# Patient Record
Sex: Female | Born: 1985
Health system: Southern US, Community
[De-identification: ages and names within clinical notes are randomized; demographics above are authoritative.]

## PROBLEM LIST (undated history)

## (undated) DIAGNOSIS — R51 Headache: Secondary | ICD-10-CM

## (undated) DIAGNOSIS — R519 Headache, unspecified: Secondary | ICD-10-CM

## (undated) DIAGNOSIS — I10 Essential (primary) hypertension: Secondary | ICD-10-CM

## (undated) DIAGNOSIS — I213 ST elevation (STEMI) myocardial infarction of unspecified site: Secondary | ICD-10-CM

## (undated) DIAGNOSIS — J45909 Unspecified asthma, uncomplicated: Secondary | ICD-10-CM

## (undated) DIAGNOSIS — D649 Anemia, unspecified: Secondary | ICD-10-CM

## (undated) DIAGNOSIS — I4901 Ventricular fibrillation: Secondary | ICD-10-CM

## (undated) DIAGNOSIS — I469 Cardiac arrest, cause unspecified: Secondary | ICD-10-CM

---

## 2013-02-05 ENCOUNTER — Emergency Department (INDEPENDENT_AMBULATORY_CARE_PROVIDER_SITE_OTHER): Payer: Self-pay

## 2013-02-05 ENCOUNTER — Encounter (HOSPITAL_COMMUNITY): Payer: Self-pay | Admitting: Emergency Medicine

## 2013-02-05 ENCOUNTER — Emergency Department (INDEPENDENT_AMBULATORY_CARE_PROVIDER_SITE_OTHER)
Admission: EM | Admit: 2013-02-05 | Discharge: 2013-02-05 | Disposition: A | Discharge status: Home / Self Care | Payer: Self-pay | Source: Home / Self Care | Attending: Family Medicine | Admitting: Family Medicine

## 2013-02-05 DIAGNOSIS — R071 Chest pain on breathing: Secondary | ICD-10-CM

## 2013-02-05 DIAGNOSIS — R0789 Other chest pain: Secondary | ICD-10-CM

## 2013-02-05 DIAGNOSIS — R05 Cough: Secondary | ICD-10-CM

## 2013-02-05 HISTORY — DX: Unspecified asthma, uncomplicated: J45.909

## 2013-02-05 MED ORDER — BENZONATATE 100 MG PO CAPS: 100.0000 mg | ORAL_CAPSULE | Freq: Three times a day (TID) | ORAL | Status: DC

## 2013-02-05 MED ORDER — IBUPROFEN 800 MG PO TABS: 800.0000 mg | ORAL_TABLET | Freq: Three times a day (TID) | ORAL | Status: DC

## 2013-02-05 NOTE — ED Notes (Signed)
Reports having sharp chest pain that comes and goes.  Chest congestion.  Productive cough with yellow sputum.  Pt has been using mucinex with mild relief in congestion.  Symptom onset Thursday evening.   Denies fever, n/v/d  Hx of asthma and bronchitis.

## 2013-02-05 NOTE — ED Provider Notes (Signed)
CSN: [id number]     Arrival date & time 02/05/13  1053 History   First MD Initiated Contact with Patient 02/05/13 1117     Chief Complaint  Patient presents with  . URI  . Chest Pain   (Consider location/radiation/quality/duration/timing/severity/associated sxs/prior Treatment) Patient is a 27 y.o. female presenting with URI and chest pain. The history is provided by the patient. No language interpreter was used.  URI Presenting symptoms: congestion and cough   Severity:  Moderate Onset quality:  Sudden Timing:  Constant Progression:  Worsening Chronicity:  New Relieved by:  None tried Worsened by:  Nothing tried Ineffective treatments:  None tried Risk factors: no recent illness   Chest Pain Associated symptoms: cough   Pt complains of a sharp pain in her chest with breathing  Past Medical History  Diagnosis Date  . Asthma    History reviewed. No pertinent past surgical history. History reviewed. No pertinent family history. History  Substance Use Topics  . Smoking status: Current Every Day Smoker -- 0.50 packs/day    Types: Cigarettes  . Smokeless tobacco: Not on file  . Alcohol Use: Yes   OB History   Grav Para Term Preterm Abortions TAB SAB Ect Mult Living                 Review of Systems  HENT: Positive for congestion.   Respiratory: Positive for cough.   Cardiovascular: Positive for chest pain.  All other systems reviewed and are negative.    Allergies  Morphine and related  Home Medications  No current outpatient prescriptions on file. BP 119/75  Pulse 89  Temp(Src) 98.4 F (36.9 C) (Oral)  Resp 16  SpO2 100%  LMP 01/16/2013 Physical Exam  Vitals reviewed. Constitutional: She appears well-developed and well-nourished.  HENT:  Head: Normocephalic.  Right Ear: External ear normal.  Left Ear: External ear normal.  Mouth/Throat: Oropharynx is clear and moist.  Eyes: Pupils are equal, round, and reactive to light.  Neck: Normal range of  motion.  Cardiovascular: Normal rate.   Pulmonary/Chest: Effort normal and breath sounds normal.  Abdominal: Soft.  Musculoskeletal: Normal range of motion.  Neurological: She is alert.  Skin: Skin is warm.  Psychiatric: She has a normal mood and affect.    ED Course  Procedures (including critical care time) Labs Review Labs Reviewed - No data to display Imaging Review Dg Chest 2 View  02/05/2013   CLINICAL DATA:  Chest pain and tightness  EXAM: CHEST  2 VIEW  COMPARISON:  None.  FINDINGS: The heart size and mediastinal contours are within normal limits. Both lungs are clear. The visualized skeletal structures are unremarkable.  IMPRESSION: No active cardiopulmonary disease.   Electronically Signed   By: Elige Ko   On: 02/05/2013 11:54    EKG Interpretation     Ventricular Rate:    PR Interval:    QRS Duration:   QT Interval:    QTC Calculation:   R Axis:     Text Interpretation:              MDM   1. Chest wall pain   2. Cough    No pneumonia,  Normal chest xray.    Pt given rx for ibuprofen and tessalon.  Pt advised to return if any problems.   Elson Areas, PA-C 02/05/13 1208  Lonia Skinner [place], PA-C 02/05/13 1209

## 2013-02-05 NOTE — ED Provider Notes (Signed)
Medical screening examination/treatment/procedure(s) were performed by resident physician or non-physician practitioner and as supervising physician I was immediately available for consultation/collaboration.   Barkley Bruns MD.   Linna Hoff, MD 02/05/13 [phone]

## 2014-02-20 ENCOUNTER — Emergency Department (HOSPITAL_BASED_OUTPATIENT_CLINIC_OR_DEPARTMENT_OTHER)
Admission: EM | Admit: 2014-02-20 | Discharge: 2014-02-20 | Disposition: A | Discharge status: Home / Self Care | Payer: Self-pay | Attending: Emergency Medicine | Admitting: Emergency Medicine

## 2014-02-20 ENCOUNTER — Encounter (HOSPITAL_BASED_OUTPATIENT_CLINIC_OR_DEPARTMENT_OTHER): Payer: Self-pay | Admitting: Emergency Medicine

## 2014-02-20 DIAGNOSIS — K088 Other specified disorders of teeth and supporting structures: Secondary | ICD-10-CM | POA: Insufficient documentation

## 2014-02-20 DIAGNOSIS — Z880 Allergy status to penicillin: Secondary | ICD-10-CM | POA: Insufficient documentation

## 2014-02-20 DIAGNOSIS — K0889 Other specified disorders of teeth and supporting structures: Secondary | ICD-10-CM

## 2014-02-20 DIAGNOSIS — J45909 Unspecified asthma, uncomplicated: Secondary | ICD-10-CM | POA: Insufficient documentation

## 2014-02-20 DIAGNOSIS — Z720 Tobacco use: Secondary | ICD-10-CM | POA: Insufficient documentation

## 2014-02-20 DIAGNOSIS — Z79899 Other long term (current) drug therapy: Secondary | ICD-10-CM | POA: Insufficient documentation

## 2014-02-20 DIAGNOSIS — K029 Dental caries, unspecified: Secondary | ICD-10-CM | POA: Insufficient documentation

## 2014-02-20 MED ORDER — IBUPROFEN 200 MG PO TABS
600.0000 mg | ORAL_TABLET | Freq: Once | ORAL | Status: AC
  Administered 2014-02-20: 01:00:00 600 mg via ORAL
  Filled 2014-02-20 (×2): qty 1

## 2014-02-20 MED ORDER — IBUPROFEN 600 MG PO TABS: 600.0000 mg | ORAL_TABLET | Freq: Four times a day (QID) | ORAL | Status: DC | PRN

## 2014-02-20 MED ORDER — HYDROCODONE-ACETAMINOPHEN 5-325 MG PO TABS: 1.0000 | ORAL_TABLET | Freq: Four times a day (QID) | ORAL | Status: DC | PRN

## 2014-02-20 MED ORDER — HYDROCODONE-ACETAMINOPHEN 5-325 MG PO TABS
1.0000 | ORAL_TABLET | Freq: Once | ORAL | Status: AC
  Administered 2014-02-20: 1 via ORAL
  Filled 2014-02-20: qty 1

## 2014-02-20 MED ORDER — CLINDAMYCIN HCL 150 MG PO CAPS: 300.0000 mg | ORAL_CAPSULE | Freq: Three times a day (TID) | ORAL | Status: DC

## 2014-02-20 NOTE — Discharge Instructions (Signed)
Dental Pain °A tooth ache may be caused by cavities (tooth decay). Cavities expose the nerve of the tooth to air and hot or cold temperatures. It may come from an infection or abscess (also called a boil or furuncle) around your tooth. It is also often caused by dental caries (tooth decay). This causes the pain you are having. °DIAGNOSIS  °Your caregiver can diagnose this problem by exam. °TREATMENT  °· If caused by an infection, it may be treated with medications which kill germs (antibiotics) and pain medications as prescribed by your caregiver. Take medications as directed. °· Only take over-the-counter or prescription medicines for pain, discomfort, or fever as directed by your caregiver. °· Whether the tooth ache today is caused by infection or dental disease, you should see your dentist as soon as possible for further care. °SEEK MEDICAL CARE IF: °The exam and treatment you received today has been provided on an emergency basis only. This is not a substitute for complete medical or dental care. If your problem worsens or new problems (symptoms) appear, and you are unable to meet with your dentist, call or return to this location. °SEEK IMMEDIATE MEDICAL CARE IF:  °· You have a fever. °· You develop redness and swelling of your face, jaw, or neck. °· You are unable to open your mouth. °· You have severe pain uncontrolled by pain medicine. °MAKE SURE YOU:  °· Understand these instructions. °· Will watch your condition. °· Will get help right away if you are not doing well or get worse. °Document Released: 03/21/2005 Document Revised: 06/13/2011 Document Reviewed: 11/07/2007 °ExitCare® Patient Information ©2015 [employer]. This information is not intended to replace advice given to you by your health care provider. Make sure you discuss any questions you have with your health care provider. ° °Emergency Department Resource Guide °1) Find a Doctor and Pay Out of Pocket °Although you won't have to find out who  is covered by your insurance plan, it is a good idea to ask around and get recommendations. You will then need to call the office and see if the doctor you have chosen will accept you as a new patient and what types of options they offer for patients who are self-pay. Some doctors offer discounts or will set up payment plans for their patients who do not have insurance, but you will need to ask so you aren't surprised when you get to your appointment. ° °2) Contact Your Local Health Department °Not all health departments have doctors that can see patients for sick visits, but many do, so it is worth a call to see if yours does. If you don't know where your local health department is, you can check in your phone book. The CDC also has a tool to help you locate your state's health department, and many state websites also have listings of all of their local health departments. ° °3) Find a Walk-in Clinic °If your illness is not likely to be very severe or complicated, you may want to try a walk in clinic. These are popping up all over the country in pharmacies, drugstores, and shopping centers. They're usually staffed by nurse practitioners or physician assistants that have been trained to treat common illnesses and complaints. They're usually fairly quick and inexpensive. However, if you have serious medical issues or chronic medical problems, these are probably not your best option. ° °No Primary Care Doctor: °- Call [facility] Connect at  832-8000 - they can help you locate a primary   care doctor that  accepts your insurance, provides certain services, etc. °- Physician Referral Service- 1-800-533-3463 ° °Chronic Pain Problems: °Organization         Address  Phone   Notes  °[facility]  (336) 297-2271 Patients need to be referred by their primary care doctor.  ° °Medication Assistance: °Organization         Address  Phone   Notes  °[place] [facility] [address]  [address] °[place], [place] [address] (336) 641-8030 --Must be a resident of [place] °-- Must have NO insurance coverage whatsoever (no Medicaid/ Medicare, etc.) °-- The pt. MUST have a primary care doctor that directs their care regularly and follows them in the community °  °[facility]  (866) 331-1348   °[employer]  (888) 892-1162   ° °Agencies that provide inexpensive medical care: °Organization         Address  Phone   Notes  °[facility]  (336) 832-8035   °[facility]    (336) 832-7272   °[facility] [address] °[place], [place] [address] (336) 832-4777   °[facility] [address], °[place] (336) 271-4999   °[facility]    (336) 373-0678   °[facility]    (336) 272-1050   °[facility] ° [address], [place] Phone:  (336) 832-4444, Fax:  (336) 832-4440 Hours of Operation:  9 am - 6 pm, M-F.  Also accepts Medicaid/Medicare and self-pay.  °[facility] ° [address], [place] Phone: (336) 832-3150, Fax: (336) 832-3151. Hours of Operation:  8:30 am - 5:30 pm, M-F.  Also accepts Medicaid and self-pay.  °[facility] [place] [address], [place] Phone: (336) 878-6027   °[facility] [address], [place], [place] (336)723-1848, Ext. 123 Mondays & Thursdays: 7-9 AM.  First 15 patients are seen on a first come, first serve basis. °  ° °Medicaid-accepting [place] Providers: ° °Organization         Address  Phone   Notes  °[facility] [address], [place] (336) 641-2100 Also accepts self-pay patients.  °[facility] [address], [place] ° (336) 856-9996   °[facility] [address], [place] (336) 288-8857   °[facility] [address] [place] [address], [place] (336) 299-7000   °Veita Bland [address], [place]  ° (336) 373-1557 Only accepts [place] Access Medicaid patients after they have their name applied to their card.  ° °Self-Pay (no insurance) in [place]: ° °Organization         Address  Phone   Notes  °Sickle Cell Patients, [facility] [address], [place] (336) 832-1970   °[facility] [address], [place] (336) 832-4400   °[facility] ° [address], [place] (336) 992-4800   °[facility]/Dr. Osei-Bonsu ° [address] [place] [address], [place] or [address], [place] (336) 841-8500 Phone number for both [place] and [place] locations is the same.  °[facility] [address], [place] (336) 299-0000   °[facility]  [address] [place] [address],  or [address] (336) 852-7530 °(336) 878-2260   °  [facility] [address], [place] (336) 350-1642, phone; (336) 294-5005, fax Sees patients 1st and 3rd Saturday of every month.  Must not qualify for public or private insurance (i.e. Medicaid, Medicare, [place] Health Choice, Veterans' Benefits) • Household income should be no more than 200% of the poverty level •The clinic cannot treat you if you are pregnant or think you are pregnant • Sexually transmitted diseases are not treated at the clinic.  ° ° °Dental Care: °Organization         Address  Phone  Notes  °[place] [facility] [address], [place] (336) 641-6152 Accepts children up to age 21 who are enrolled in Medicaid or [place] Health Choice; pregnant women with a Medicaid card; and children who have applied for Medicaid or [place] Health Choice, but were declined, whose parents can pay a reduced fee at time of service.  °[facility] [place]  [address], [place] (336) 641-7733 Accepts children up to age 21 who are  enrolled in Medicaid or [place] Health Choice; pregnant women with a Medicaid card; and children who have applied for Medicaid or [place] Health Choice, but were declined, whose parents can pay a reduced fee at time of service.  °[facility] ° [address], [place] (336) 641-4533 Patients are seen by appointment only. Walk-ins are not accepted. [facility] will see patients 18 years of age and older. °Monday - Tuesday (8am-5pm) °Most Wednesdays (8:30-5pm) °$30 per visit, cash only  °[facility] ° [address], [place] (336) 641-4533 Patients are seen by appointment only. Walk-ins are not accepted. [facility] will see patients 18 years of age and older. °One Wednesday Evening (Monthly: Volunteer Based).  $30 per visit, cash only  °[facility]  (919) 537-3737 for adults; Children under age 4, call Graduate Pediatric Dentistry at (919) [phone]. Children aged 4-14, please call (919) [phone] to request a pediatric application. ° Dental services are provided in all areas of dental care including fillings, crowns and bridges, complete and partial dentures, implants, gum treatment, root canals, and extractions. Preventive care is also provided. Treatment is provided to both adults and children. °Patients are selected via a lottery and there is often a waiting list. °  °[facility] [address], °[place] ° (336) 763-8833 [email or web address] °  °[facility] [address], [place], [place] (336)723-1848, Ext. 123 Second and Fourth Thursday of each month, opens at 6:30 AM; Clinic ends at 9 AM.  Patients are seen on a first-come first-served basis, and a limited number are seen during each clinic.  ° °[facility] ° [address], [place], [place] (336) 723-7904   Eligibility Requirements °You must have lived in [place], [place], or [place] counties for at least the last three months. °  You  cannot be eligible for state or federal sponsored healthcare insurance, including Veterans Administration, Medicaid, or Medicare. °  You generally cannot be eligible for healthcare insurance through your employer.  °  How to apply: °Eligibility screenings are held every Tuesday and Wednesday afternoon from 1:00 pm until 4:00 pm. You do not need an appointment for the interview!  °[facility] [address], [place],  336-631-2330   °[facility]  336-342-8273   °[facility]  336-703-3100   °[facility]  336-570-6415   ° °Behavioral Health Resources in the Community: °Intensive Outpatient Programs °Organization         Address  Phone  Notes  °[facility] [address], [place], [place] 336-878-6098   °[facility] [address], [place], [place] 336-832-9800   °[facility] [address], [place], [place] ° 336-882-2125   °[facility] [address],  °[place], [place] 1-800-853-5163 or 336-641-4981   °Substance Abuse Resources °Organization         Address  Phone  Notes  °[facility]  336-882-2125   °[facility]  336-784-9470   °[facility]  336-285-9073   °[facility]  336-845-3988   °Residential & Outpatient Substance Abuse Program  1-800-659-3381   °Psychological Services °Organization         Address  Phone  Notes  °[facility]  336- 832-9600   °[facility]  336- 378-7881   °[facility] [address], [place] 1-800-853-5163 or 336-641-4981   ° °Mobile Crisis Teams °Organization         Address  Phone  Notes  °[facility], Mobile Crisis Care Unit  1-877-626-1772   °Assertive °[facility] ° [address] [place], [place] 336-834-9664   °Sharon DeEsch [address] °[place] 336-554-5454   ° °Self-Help/Support  Groups °Organization         Address  Phone             Notes  °[facility] - variety of support groups  336- 373-1402 Call for more information  °Narcotics Anonymous (NA), [facility] [address], °[place]  2 meetings at this location  ° °Residential Treatment Programs °Organization         Address  Phone  Notes  °[facility] [address],    °[place]  1-866-801-8205   °[facility] ° [address], [place], [place] 704-293-8524   °[facility] [address], [place] 336-845-3988 Admissions: 8am-3pm M-F  °[facility] [address]    °[place], [place] 336-841-1104   °[facility] [address], [place], [place] 336-379-7146   °[facility] [address]  °[place], [place] 336-285-9073   °[facility] Intensive Outpatient [address], [place], [place] 336-852-3033   °[facility] [address]  °[place], [place] 1-877-615-2722 or 336-784-9470   °[facility] [address] [place], [place] 336-227-7417 Accepts Medicaid  °[facility] [address]  °[place] 1-800-659-3381 Substance Abuse/Addiction Treatment  ° °[place] Behavioral Health Resources °Organization         Address  Phone  Notes  °[facility]  (888) 581-9988   °Julie Brannon, PhD [address] [place], [place]   (336) 349-5553 or (336) 951-0000   °[facility]   [address] °[place], [place] (336) 349-4454   °[facility] [address], [place],  (336) 342-8316 Insurance/Medicaid/sponsorship through [facility]  °[facility] [address]                                    [place],  (336) 342-8316 Therapy/tele-psych/case  °[facility]   [address]  ° [place], [place] (336) 349-2233    °Dr. Arfeen  (336) 349-4544   °[facility] [place]  [facility]  [employer] [facility] [place] [facility] 1) [address], [place] °2) [address], [place] °3)  [address], [place] (336) 349-3220 °(336) 342-7768 ° °(336) 342-8140   °[place] [facility] (336) 342-1394 or (336) 342-3537 (After Hours)    ° ° ° °

## 2014-02-20 NOTE — ED Provider Notes (Signed)
CSN: [id number]     Arrival date & time 02/20/14  0054 History   First MD Initiated Contact with Patient 02/20/14 0102     Chief Complaint  Patient presents with  . Dental Pain     (Consider location/radiation/quality/duration/timing/severity/associated sxs/prior Treatment) HPI  This is a 28 year old female who presents with dental pain. Patient reports that she thinks her right upper wisdom tooth has come in. He reports 10 out of 10 pain at the site. Denies any facial swelling or fevers. She does not currently have a dentist. She denies any sore throat or difficulty swallowing.  Past Medical History  Diagnosis Date  . Asthma    History reviewed. No pertinent past surgical history. No family history on file. History  Substance Use Topics  . Smoking status: Current Every Day Smoker -- 0.50 packs/day    Types: Cigarettes  . Smokeless tobacco: Not on file  . Alcohol Use: Yes   OB History    No data available     Review of Systems  Constitutional: Negative for fever.  HENT: Positive for dental problem. Negative for sore throat.   All other systems reviewed and are negative.     Allergies  Morphine and related and Penicillins  Home Medications   Prior to Admission medications   Medication Sig Start Date End Date Taking? Authorizing Provider  benzonatate (TESSALON) 100 MG capsule Take 1 capsule (100 mg total) by mouth every 8 (eight) hours. 02/05/13   Elson AreasLeslie K Sofia, PA-C  clindamycin (CLEOCIN) 150 MG capsule Take 2 capsules (300 mg total) by mouth 3 (three) times daily. 02/20/14   Shon Batonourtney F Salayah Meares, MD  HYDROcodone-acetaminophen (NORCO/VICODIN) 5-325 MG per tablet Take 1 tablet by mouth every 6 (six) hours as needed for moderate pain. 02/20/14   Shon Batonourtney F Yarexi Pawlicki, MD  ibuprofen (ADVIL,MOTRIN) 600 MG tablet Take 1 tablet (600 mg total) by mouth every 6 (six) hours as needed. 02/20/14   Shon Batonourtney F Errick Salts, MD   BP 129/93 mmHg  Pulse 100  Temp(Src) 98.5 F (36.9 C) (Oral)   Resp 19  Ht 5\' 3"  (1.6 m)  Wt 150 lb (68.04 kg)  BMI 26.58 kg/m2  SpO2 100%  LMP 01/20/2014 Physical Exam  Constitutional: She is oriented to person, place, and time. She appears well-developed and well-nourished.  HENT:  Head: Normocephalic and atraumatic.  Mouth/Throat: Oropharynx is clear and moist.  Multiple dental caries, cavity noted over right upper molar, no obvious abscess, no trismus  Neck: Neck supple.  Cardiovascular: Normal rate and regular rhythm.   Pulmonary/Chest: Effort normal. No respiratory distress.  Lymphadenopathy:    She has no cervical adenopathy.  Neurological: She is alert and oriented to person, place, and time.  Skin: Skin is warm and dry.  Psychiatric: She has a normal mood and affect.  Nursing note and vitals reviewed.   ED Course  Procedures (including critical care time) Labs Review Labs Reviewed - No data to display  Imaging Review No results found.   EKG Interpretation None      MDM   Final diagnoses:  Pain, dental    History presents with dental pain. No obvious signs of infection. Generally poor dentition with multiple caries. Patient will be given short course of pain medication, ibuprofen, and clindamycin given penicillin allergy. She was given a Facilities managerresource guide for dental resources. Encouraged to discontinue smoking.  After history, exam, and medical workup I feel the patient has been appropriately medically screened and is safe for discharge home. Pertinent  diagnoses were discussed with the patient. Patient was given return precautions.     Shon Batonourtney F Solana Coggin, MD 02/20/14 [phone]

## 2014-02-20 NOTE — ED Notes (Signed)
PT presents to ED with complaints of dental pain . Patient states her wisdom teeth are coming in and one broke.

## 2014-04-04 ENCOUNTER — Encounter (HOSPITAL_BASED_OUTPATIENT_CLINIC_OR_DEPARTMENT_OTHER): Payer: Self-pay | Admitting: *Deleted

## 2014-04-04 ENCOUNTER — Emergency Department (HOSPITAL_BASED_OUTPATIENT_CLINIC_OR_DEPARTMENT_OTHER): Payer: Self-pay

## 2014-04-04 ENCOUNTER — Emergency Department (HOSPITAL_BASED_OUTPATIENT_CLINIC_OR_DEPARTMENT_OTHER)
Admission: EM | Admit: 2014-04-04 | Discharge: 2014-04-04 | Disposition: A | Discharge status: Home / Self Care | Payer: Self-pay | Attending: Emergency Medicine | Admitting: Emergency Medicine

## 2014-04-04 DIAGNOSIS — Z720 Tobacco use: Secondary | ICD-10-CM | POA: Insufficient documentation

## 2014-04-04 DIAGNOSIS — J45901 Unspecified asthma with (acute) exacerbation: Secondary | ICD-10-CM | POA: Insufficient documentation

## 2014-04-04 DIAGNOSIS — Z880 Allergy status to penicillin: Secondary | ICD-10-CM | POA: Insufficient documentation

## 2014-04-04 DIAGNOSIS — R059 Cough, unspecified: Secondary | ICD-10-CM

## 2014-04-04 DIAGNOSIS — R05 Cough: Secondary | ICD-10-CM

## 2014-04-04 MED ORDER — ALBUTEROL SULFATE (2.5 MG/3ML) 0.083% IN NEBU: 2.5000 mg | INHALATION_SOLUTION | Freq: Four times a day (QID) | RESPIRATORY_TRACT | Status: DC | PRN

## 2014-04-04 MED ORDER — PREDNISONE 20 MG PO TABS: 40.0000 mg | ORAL_TABLET | Freq: Every day | ORAL | Status: DC

## 2014-04-04 MED ORDER — ALBUTEROL SULFATE HFA 108 (90 BASE) MCG/ACT IN AERS
2.0000 | INHALATION_SPRAY | RESPIRATORY_TRACT | Status: DC | PRN
  Administered 2014-04-04: 2 via RESPIRATORY_TRACT
  Filled 2014-04-04: qty 6.7

## 2014-04-04 NOTE — Discharge Instructions (Signed)

## 2014-04-04 NOTE — ED Provider Notes (Signed)
CSN: [id number]     Arrival date & time 04/04/14  1550 History   None    Chief Complaint  Patient presents with  . URI     (Consider location/radiation/quality/duration/timing/severity/associated sxs/prior Treatment) HPI Comments: Pt is out of her inhaler and nebs at home  Patient is a 29 y.o. female presenting with URI. The history is provided by the patient. No language interpreter was used.  URI Presenting symptoms: congestion, cough and sore throat   Presenting symptoms: no fever   Severity:  Moderate Onset quality:  Sudden Duration:  4 days Timing:  Constant Progression:  Unchanged Chronicity:  New Relieved by:  Nothing Worsened by:  Nothing tried Ineffective treatments:  None tried Associated symptoms: wheezing   Associated symptoms: no neck pain     Past Medical History  Diagnosis Date  . Asthma    Past Surgical History  Procedure Laterality Date  . Cesarean section     History reviewed. No pertinent family history. History  Substance Use Topics  . Smoking status: Current Every Day Smoker -- 0.50 packs/day    Types: Cigarettes  . Smokeless tobacco: Not on file  . Alcohol Use: Yes     Comment: socially   OB History    No data available     Review of Systems  Constitutional: Negative for fever.  HENT: Positive for congestion and sore throat.   Respiratory: Positive for cough and wheezing.   Musculoskeletal: Negative for neck pain.  All other systems reviewed and are negative.     Allergies  Morphine and related and Penicillins  Home Medications   Prior to Admission medications   Medication Sig Start Date End Date Taking? Authorizing Provider  albuterol (PROVENTIL) (2.5 MG/3ML) 0.083% nebulizer solution Take 3 mLs (2.5 mg total) by nebulization every 6 (six) hours as needed for wheezing or shortness of breath. 04/04/14   Teressa Lower, NP  predniSONE (DELTASONE) 20 MG tablet Take 2 tablets (40 mg total) by mouth daily. 04/04/14   Teressa Lower,  NP   BP 138/88 mmHg  Pulse 94  Temp(Src) 99 F (37.2 C) (Oral)  Resp 18  SpO2 100%  LMP 03/28/2014 Physical Exam  Constitutional: She appears well-developed and well-nourished.  HENT:  Head: Normocephalic and atraumatic.  Nose: Rhinorrhea present.  Mouth/Throat: Posterior oropharyngeal erythema present.  Eyes: Conjunctivae and EOM are normal. Pupils are equal, round, and reactive to light.  Neck: Normal range of motion. Neck supple.  Cardiovascular: Normal rate and regular rhythm.   Pulmonary/Chest: Effort normal. She has wheezes.  Musculoskeletal: Normal range of motion.  Neurological: She is alert.  Skin: Skin is warm and dry.  Nursing note and vitals reviewed.   ED Course  Procedures (including critical care time) Labs Review Labs Reviewed - No data to display  Imaging Review Dg Chest 2 View  04/04/2014   CLINICAL DATA:  Cough.  Sore throat.  Runny nose.  EXAM: CHEST  2 VIEW  COMPARISON:  02/05/2013  FINDINGS: Midline trachea.  Normal heart size and mediastinal contours.  Sharp costophrenic angles.  No pneumothorax.  Clear lungs.  IMPRESSION: No acute cardiopulmonary disease.   Electronically Signed   By: Jeronimo Greaves M.D.   On: 04/04/2014 16:22     EKG Interpretation None      MDM   Final diagnoses:  Asthma exacerbation    Pt given an inhaler here and refills on her albuterol nebs. No infection noted on x-ray. Pt not in any distress  Teressa Lower, NP 04/04/14 1814  Rolan Bucco, MD 04/04/14 2120

## 2014-04-04 NOTE — ED Notes (Addendum)
Pt reports URI.  Productive cough x 2-3 days.  Denies fever

## 2014-06-01 ENCOUNTER — Emergency Department (HOSPITAL_COMMUNITY)
Admission: EM | Admit: 2014-06-01 | Discharge: 2014-06-02 | Disposition: A | Discharge status: Home / Self Care | Payer: Self-pay | Attending: Emergency Medicine | Admitting: Emergency Medicine

## 2014-06-01 ENCOUNTER — Encounter (HOSPITAL_COMMUNITY): Payer: Self-pay | Admitting: Emergency Medicine

## 2014-06-01 DIAGNOSIS — K088 Other specified disorders of teeth and supporting structures: Secondary | ICD-10-CM | POA: Insufficient documentation

## 2014-06-01 DIAGNOSIS — J45909 Unspecified asthma, uncomplicated: Secondary | ICD-10-CM | POA: Insufficient documentation

## 2014-06-01 DIAGNOSIS — Z720 Tobacco use: Secondary | ICD-10-CM | POA: Insufficient documentation

## 2014-06-01 DIAGNOSIS — K029 Dental caries, unspecified: Secondary | ICD-10-CM | POA: Insufficient documentation

## 2014-06-01 DIAGNOSIS — Z79899 Other long term (current) drug therapy: Secondary | ICD-10-CM | POA: Insufficient documentation

## 2014-06-01 DIAGNOSIS — Z7952 Long term (current) use of systemic steroids: Secondary | ICD-10-CM | POA: Insufficient documentation

## 2014-06-01 NOTE — ED Notes (Signed)
Pt states that she has L lower dental pain. Hx of broken tooth. Has been hurting like this x 1 week. Alert and oriented.

## 2014-06-02 MED ORDER — OXYCODONE-ACETAMINOPHEN 5-325 MG PO TABS
2.0000 | ORAL_TABLET | Freq: Once | ORAL | Status: AC
  Administered 2014-06-02: 2 via ORAL
  Filled 2014-06-02: qty 2

## 2014-06-02 MED ORDER — CLINDAMYCIN HCL 300 MG PO CAPS
300.0000 mg | ORAL_CAPSULE | Freq: Once | ORAL | Status: AC
  Administered 2014-06-02: 300 mg via ORAL
  Filled 2014-06-02: qty 1

## 2014-06-02 MED ORDER — CLINDAMYCIN HCL 150 MG PO CAPS: 300.0000 mg | ORAL_CAPSULE | Freq: Three times a day (TID) | ORAL | Status: DC

## 2014-06-02 MED ORDER — OXYCODONE-ACETAMINOPHEN 5-325 MG PO TABS: 1.0000 | ORAL_TABLET | ORAL | Status: DC | PRN

## 2014-06-02 NOTE — ED Provider Notes (Signed)
CSN: [id number]     Arrival date & time 06/01/14  2220 History   First MD Initiated Contact with Patient 06/02/14 0024     Chief Complaint  Patient presents with  . Dental Pain     (Consider location/radiation/quality/duration/timing/severity/associated sxs/prior Treatment) Patient is a 29 y.o. female presenting with tooth pain. The history is provided by the patient and medical records. No language interpreter was used.  Dental Pain Associated symptoms: no drooling, no facial swelling, no fever, no headaches and no neck pain     Margaret Carpenter is a 29 y.o. female  with a hx of asthma presents to the Emergency Department complaining of gradual, persistent, progressively worsening left lower dental pain onset 3 months ago. Patient reports it has significantly worse the last several weeks and then again in the last 2 days. She reports that she is miserable, unable to sleep or eat due to her pain. She reports that she recently had several with some teeth pulled but did not mention the pain in this tooth to her dentist at that time. Patient denies fever, chills, headache, neck pain, neck stiffness, nausea, vomiting, diarrhea, pulled a swallowing, swelling of her face, otalgia.  Food and cold air makes the pain worse and nothing seems to make it better.  Past Medical History  Diagnosis Date  . Asthma    No past surgical history on file. No family history on file. History  Substance Use Topics  . Smoking status: Current Every Day Smoker -- 0.50 packs/day    Types: Cigarettes  . Smokeless tobacco: Not on file  . Alcohol Use: Yes     Comment: socially   OB History    No data available     Review of Systems  Constitutional: Negative for fever, chills and appetite change.  HENT: Positive for dental problem. Negative for drooling, ear pain, facial swelling, nosebleeds, postnasal drip, rhinorrhea and trouble swallowing.   Eyes: Negative for pain and redness.  Respiratory: Negative for cough  and wheezing.   Cardiovascular: Negative for chest pain.  Gastrointestinal: Negative for nausea, vomiting and abdominal pain.  Musculoskeletal: Negative for neck pain and neck stiffness.  Skin: Negative for color change and rash.  Neurological: Negative for weakness, light-headedness and headaches.  All other systems reviewed and are negative.     Allergies  Hydrocodone; Morphine and related; and Penicillins  Home Medications   Prior to Admission medications   Medication Sig Start Date End Date Taking? Authorizing Provider  albuterol (PROVENTIL) (2.5 MG/3ML) 0.083% nebulizer solution Take 3 mLs (2.5 mg total) by nebulization every 6 (six) hours as needed for wheezing or shortness of breath. 04/04/14   Teressa Lower, NP  clindamycin (CLEOCIN) 150 MG capsule Take 2 capsules (300 mg total) by mouth 3 (three) times daily. May dispense as  capsules 06/02/14   Errin Whitelaw, PA-C  oxyCODONE-acetaminophen (PERCOCET) 5-325 MG per tablet Take 1 tablet by mouth every 4 (four) hours as needed. 06/02/14   Rolen Conger, PA-C  predniSONE (DELTASONE) 20 MG tablet Take 2 tablets (40 mg total) by mouth daily. 04/04/14   Teressa Lower, NP   BP 118/92 mmHg  Pulse 97  Temp(Src) 98 F (36.7 C) (Oral)  SpO2 98%  LMP 04/25/2014 Physical Exam  Constitutional: She appears well-developed and well-nourished.  HENT:  Head: Normocephalic.  Right Ear: Tympanic membrane, external ear and ear canal normal.  Left Ear: Tympanic membrane, external ear and ear canal normal.  Nose: Nose normal. Right sinus exhibits no maxillary  sinus tenderness and no frontal sinus tenderness. Left sinus exhibits no maxillary sinus tenderness and no frontal sinus tenderness.  Mouth/Throat: Uvula is midline, oropharynx is clear and moist and mucous membranes are normal. No oral lesions. Abnormal dentition. Dental caries present. No uvula swelling or lacerations. No oropharyngeal exudate, posterior oropharyngeal  edema, posterior oropharyngeal erythema or tonsillar abscesses.  No gingival swelling, fluctuance or induration No gross abscess  Large dental carry in tooth # 20 with TTP but no surrounding erythema or induration of the gingiva  Eyes: Conjunctivae are normal. Pupils are equal, round, and reactive to light. Right eye exhibits no discharge. Left eye exhibits no discharge.  Neck: Normal range of motion. Neck supple.  No stridor Handling secretions without difficulty No nuchal rigidity No cervical lymphadenopathy   Cardiovascular: Normal rate, regular rhythm, normal heart sounds and intact distal pulses.   Pulmonary/Chest: Effort normal. No respiratory distress.  Equal chest rise  Abdominal: Soft. Bowel sounds are normal. She exhibits no distension. There is no tenderness.  Lymphadenopathy:    She has no cervical adenopathy.  Neurological: She is alert.  Skin: Skin is warm and dry.  Psychiatric: She has a normal mood and affect.  Nursing note and vitals reviewed.   ED Course  Procedures (including critical care time) Labs Review Labs Reviewed - No data to display  Imaging Review No results found.   EKG Interpretation None      MDM   Final diagnoses:  Pain due to dental caries    Margaret Carpenter presents with dental pain. Patient with toothache.  No gross abscess.  Exam unconcerning for Ludwig's angina or spread of infection.  Will treat with penicillin and pain medicine.  Urged patient to follow-up with dentist.     I have personally reviewed patient's vitals, nursing note and any pertinent labs or imaging.  I performed an focused physical exam; undressed when appropriate .    It has been determined that no acute conditions requiring further emergency intervention are present at this time. The patient/guardian have been advised of the diagnosis and plan. I reviewed any labs and imaging including any potential incidental findings. We have discussed signs and symptoms that  warrant return to the ED and they are listed in the discharge instructions.    Vital signs are stable at discharge.   BP 118/92 mmHg  Pulse 97  Temp(Src) 98 F (36.7 C) (Oral)  SpO2 98%  LMP 04/25/2014       Dierdre ForthHannah Jayren Cease, PA-C 06/02/14 [id number]  Olivia Mackielga M Otter, MD 06/02/14 [phone]

## 2014-06-02 NOTE — Discharge Instructions (Signed)
1. Medications: percocet, clindamycin, usual home medications 2. Treatment: rest, drink plenty of fluids, take medications as prescribed 3. Follow Up: Please followup with dentistry within 1 week for discussion of your diagnoses and further evaluation after today's visit; if you do not have a primary care doctor use the resource guide provided to find one; Return to the ER for high fevers, difficulty breathing, difficulty swallowing or other concerning symptoms  Margaret Carpenter, DDS Address: [address] [place], [place] [address] [phone]    Emergency Department Resource Guide 1) Find a Doctor and Pay Out of Pocket Although you won't have to find out who is covered by your insurance plan, it is a good idea to ask around and get recommendations. You will then need to call the office and see if the doctor you have chosen will accept you as a new patient and what types of options they offer for patients who are self-pay. Some doctors offer discounts or will set up payment plans for their patients who do not have insurance, but you will need to ask so you aren't surprised when you get to your appointment.  2) Contact Your Local Health Department Not all health departments have doctors that can see patients for sick visits, but many do, so it is worth a call to see if yours does. If you don't know where your local health department is, you can check in your phone book. The CDC also has a tool to help you locate your state's health department, and many state websites also have listings of all of their local health departments.  3) Find a Walk-in Clinic If your illness is not likely to be very severe or complicated, you may want to try a walk in clinic. These are popping up all over the country in pharmacies, drugstores, and shopping centers. They're usually staffed by nurse practitioners or physician assistants that have been trained to treat common illnesses and complaints. They're usually  fairly quick and inexpensive. However, if you have serious medical issues or chronic medical problems, these are probably not your best option.  No Primary Care Doctor: - Call Health Connect at  [phone] - they can help you locate a primary care doctor that  accepts your insurance, provides certain services, etc. - Physician Referral Service- [phone]  Chronic Pain Problems: Organization         Address  Phone   Notes  [facility]  [phone] Patients need to be referred by their primary care doctor.   Medication Assistance: Organization         Address  Phone   Notes  [facility] Medication [facility] [address] [place] [place], [place] [address] [phone] --Must be a resident of [facility] -- Must have NO insurance coverage whatsoever (no Medicaid/ Medicare, etc.) -- The pt. MUST have a primary care doctor that directs their care regularly and follows them in the community   [facility]ist  [phone]   [employer]  [phone]    Agencies that provide inexpensive medical care: Organization         Address  Phone   Notes  [facility]  [phone]   [facility]    [phone]   [facility] [address] [place], [place] [address] [phone]   [facility] [address], [place] [phone]   Planned Parenthood    [phone]  [facility]    [phone]   [facility] and [facility]  [address], [place] Phone:  [phone], Fax:  [phone] Hours of Operation:  9 am - 6 pm, M-F.  Also accepts Medicaid/Medicare and self-pay.  [facility]  [address], [place] Phone: [phone], Fax: [phone]. Hours of Operation:  8:30 am - 5:30 pm, M-F.  Also accepts Medicaid and self-pay.  [facility] [place] [address], [place] Phone: [phone]   [facility] [address] Natasha Bence [place], [place] [phone], Ext. [phone] Mondays & Thursdays: 7-9 AM.  First 15 patients are seen on a first come, first serve basis.    Medicaid-accepting [facility] Providers:  Organization         Address  Phone   Notes  [facility] [address], [place] [phone] Also accepts self-pay patients.  [facility] [address] Laurell Josephs [place], [place]  [phone]   [facility] [address], [place] [phone]   [facility] [address], [place] [phone]   Renaye Rakers [address], [place]   [phone] Only accepts [place] Access [place] patients after they have their name applied to their card.   Self-Pay (no insurance) in [facility]:  Organization         Address  Phone   Notes  Sickle Cell Patients, [facility] Internal Medicine [address] [place], [place] [phone]   [facility] Urgent Care [address] [place], [place] [phone]   [facility]  [address], [place] [phone]   [facility]/Dr. Osei-Bonsu  [address] [place] or [address], [place] [phone] Phone number for both [place] and [place] locations is the same.  Urgent Medical and [facility] [address] [place] [phone]   [facility] [address] [place] or [address] [phone]   [facility] [address] [place] [phone], phone; [phone], fax Sees patients 1st and 3rd Saturday of every month.  Must not qualify for public or private insurance (i.e. Medicaid, Medicare, [place] Health Choice, Veterans' Benefits)  Household income should be no more than 200% of the poverty level The clinic cannot treat you if  you are pregnant or think you are pregnant  Sexually transmitted diseases are not treated at the clinic.    Dental Care: Organization         Address  Phone  Notes  [facility] [address] [place], [place] [phone] Accepts children up to age 52 who are enrolled in [place] or [place] Health Choice; pregnant women with a Medicaid card; and children who have applied for Medicaid or [place] Health Choice, but were declined, whose parents can pay a reduced fee at time of service.  [facility] Department of [facility]  [address], [place] [phone] Accepts children up to age 89 who are enrolled in [place] or [place] Health Choice; pregnant women with a Medicaid card; and children who have applied for Medicaid or [place] Health Choice, but were declined, whose parents can pay a reduced fee at time  of service.  [facility]  [address], [place] [phone] Patients are seen by appointment only. Walk-ins are not accepted. [facility] will see patients 28 years of age and older. Monday - Tuesday (8am-5pm) Most Wednesdays (8:30-5pm) $30 per visit, cash only  [facility]  [address], [facility] [phone] Patients are seen by appointment only. Walk-ins are not accepted. [facility] will see patients 28 years of age and older. One Wednesday Evening (Monthly: Volunteer Based).  $30 per visit, cash only  [facility]  [phone] for adults; Children under age 384, call Graduate Pediatric Dentistry at [phone]) [phone]. Children aged 634-14, please call [phone] to request a pediatric application.  Dental services are provided in all areas of dental care including fillings, crowns and bridges, complete and partial dentures, implants, gum treatment, root canals, and extractions. Preventive care is also provided. Treatment is  provided to both adults and children. Patients are selected via a lottery and there is often a waiting list.   [facility] [address], [place]  [phone] [email or web address]   [facility] [address], [place], [place] [phone], Ext. [phone] Second and Fourth Thursday of each month, opens at 6:30 AM; Clinic ends at 9 AM.  Patients are seen on a first-come first-served basis, and a limited number are seen during each clinic.   [facility]  [address], [place], [place] [phone]   Eligibility Requirements You must have lived in [place], [place], or [place] counties for at least the last three months.   You cannot be eligible for state or federal sponsored National Cityhealthcare insurance, including CIGNAVeterans Administration, [place]icaid, or [employer]icare.   You generally cannot be eligible for healthcare insurance through your employer.    How to apply: Eligibility screenings are held every Tuesday and Wednesday afternoon from 1:00 pm until 4:00 pm. You do not need an appointment for the interview!  [facility] [address], [place], [place] [phone]   [facility]  [phone]   [facility]  [phone]   [facility]  [phone]    Behavioral Health Resources in the Community: Intensive Outpatient Programs Organization         Address  Phone  Notes  [facility] [address], [place], [place] [phone]   [facility] Outpatient [address], [place], [place] [phone]   [facility] [address], [place], [place]  [phone]   [facility] [address],  [place], [place] [phone] or [phone]   Substance Abuse Resources Organization         Address  Phone  Notes  [facility]  [phone]   [facility]  [phone]   [facility]  [facility]  [phone]   [facility]  [phone]   Residential & Outpatient Substance Abuse Program  [phone]   Psychological Services Organization         Address  Phone  Notes  [facility]  [phone]   [facility]  [phone]   [facility] [address], [place] [phone] or [phone]    Mobile Crisis Teams Organization         Address  Phone  Notes  [facility], Mobile Crisis Care Unit  [phone]   Assertive Psychotherapeutic  Services  [address] [place], [place] [phone]   [facility] [address] [place] [phone]    Self-Help/Support Groups Organization         Address  Phone             Notes  [facility] [place] - variety of support groups  [phone] Call for more information  Narcotics Anonymous (NA), [facility] [address], [employer] [place]  2 meetings at this location   Statistician         Address  Phone  Notes  [facility] [address],    [place]  [phone]   [facility]  [address], [place] [address], [place], [place] [phone]   [facility] [address] [place], [place] [phone] Admissions: 8am-3pm M-F  [facility] [address],    [place], [place] [phone]   [facility] [address] [place], [place], [place] [phone]   [facility] [address]  [place], [place] [phone]   [facility] - Intensive Outpatient [address], [place], [place] [phone]   [facility] (Addiction [facility] [address] [place]  [place], [place] [phone] or [phone]   [facility] ([facility]) [address], [place], [place] [phone] Accepts Medicaid  [facility] [address],  [place] [phone] Substance Abuse/Addiction Treatment     [facility] Organization         Address  Phone  Notes  [facility]  [phone]   Angie Fava, PhD [address] Ervin Knack [place], [place]   [phone] or [phone]   [facility]   [address] [place], [place] [phone]   [facility] [address] [place], [place] [phone] Insurance/Medicaid/sponsorship through [facility] and Families [address]                                    [place], [place] [phone] Therapy/tele-psych/case  [facility] [address], [place] [phone]    Dr. Lolly Mustache  [phone]   [facility]  [employer] [facility] Dept. 1) [address] [place] 2) [address] [place] 3)  [address], [place] [phone]  [phone]   [facility] Child Abuse Hotline [phone] or [phone] (After Hours)

## 2014-07-01 ENCOUNTER — Encounter (HOSPITAL_COMMUNITY): Payer: Self-pay | Admitting: Emergency Medicine

## 2014-07-01 ENCOUNTER — Emergency Department (HOSPITAL_COMMUNITY)
Admission: EM | Admit: 2014-07-01 | Discharge: 2014-07-02 | Disposition: A | Discharge status: Home / Self Care | Payer: Self-pay | Attending: Emergency Medicine | Admitting: Emergency Medicine

## 2014-07-01 DIAGNOSIS — J45909 Unspecified asthma, uncomplicated: Secondary | ICD-10-CM | POA: Insufficient documentation

## 2014-07-01 DIAGNOSIS — K0889 Other specified disorders of teeth and supporting structures: Secondary | ICD-10-CM

## 2014-07-01 DIAGNOSIS — K088 Other specified disorders of teeth and supporting structures: Secondary | ICD-10-CM | POA: Insufficient documentation

## 2014-07-01 DIAGNOSIS — K0381 Cracked tooth: Secondary | ICD-10-CM | POA: Insufficient documentation

## 2014-07-01 DIAGNOSIS — Z792 Long term (current) use of antibiotics: Secondary | ICD-10-CM | POA: Insufficient documentation

## 2014-07-01 DIAGNOSIS — Z880 Allergy status to penicillin: Secondary | ICD-10-CM | POA: Insufficient documentation

## 2014-07-01 DIAGNOSIS — Z7952 Long term (current) use of systemic steroids: Secondary | ICD-10-CM | POA: Insufficient documentation

## 2014-07-01 DIAGNOSIS — Z79899 Other long term (current) drug therapy: Secondary | ICD-10-CM | POA: Insufficient documentation

## 2014-07-01 DIAGNOSIS — Z720 Tobacco use: Secondary | ICD-10-CM | POA: Insufficient documentation

## 2014-07-01 MED ORDER — BUPIVACAINE-EPINEPHRINE (PF) 0.5% -1:200000 IJ SOLN
1.8000 mL | Freq: Once | INTRAMUSCULAR | Status: AC
  Administered 2014-07-01: 1.8 mL
  Filled 2014-07-01: qty 1.8

## 2014-07-01 NOTE — ED Notes (Signed)
Pt presents with L lower dental pain ongoing X 7 months.  Airway intact.

## 2014-07-01 NOTE — ED Provider Notes (Signed)
CSN: [id number]     Arrival date & time 07/01/14  2311 History  This chart was scribed for Harle BattiestElizabeth Kailynne Ferrington, NP, working with Derwood KaplanAnkit Nanavati, MD by Elon SpannerGarrett Cook, ED Scribe. This patient was seen in room TR05C/TR05C and the patient's care was started at 11:49 PM.   Chief Complaint  Patient presents with  . Dental Pain   HPI HPI Comments: Margaret Carpenter is a 29 y.o. female who presents to the Emergency Department complaining of intermittent left lower dental pain onset 6 months ago with recurrence 3 days ago.  The patient reports the complaint initially originated from a chipped a tooth on a piece of hard bacon.  Patient reports she has previously been seen for this complaint and was prescribed antibiotics and pain medication.  She has been compliant but has not observed improvement.  Her most recent course of antibiotics was three weeks ago.  She reports she is uninsured and requests that she be referred to a dentist who accepts uninsured patients.  Shae rates her pain as 10/10 and describes the pain as aching.    Past Medical History  Diagnosis Date  . Asthma    History reviewed. No pertinent past surgical history. No family history on file. History  Substance Use Topics  . Smoking status: Current Every Day Smoker -- 0.50 packs/day    Types: Cigarettes  . Smokeless tobacco: Not on file  . Alcohol Use: Yes     Comment: socially   OB History    No data available     Review of Systems  HENT: Positive for dental problem. Negative for facial swelling.   Gastrointestinal: Negative for nausea.      Allergies  Hydrocodone; Morphine and related; and Penicillins  Home Medications   Prior to Admission medications   Medication Sig Start Date End Date Taking? Authorizing Provider  acetaminophen (TYLENOL) 500 MG tablet Take 500 mg by mouth every 6 (six) hours as needed for moderate pain.    Historical Provider, MD  albuterol (PROVENTIL) (2.5 MG/3ML) 0.083% nebulizer solution Take 3 mLs  (2.5 mg total) by nebulization every 6 (six) hours as needed for wheezing or shortness of breath. 04/04/14   Teressa LowerVrinda Pickering, NP  clindamycin (CLEOCIN) 150 MG capsule Take 2 capsules (300 mg total) by mouth 3 (three) times daily. May dispense as 150mg  capsules 06/02/14   Hannah Muthersbaugh, PA-C  oxyCODONE-acetaminophen (PERCOCET) 5-325 MG per tablet Take 1 tablet by mouth every 4 (four) hours as needed. 06/02/14   Hannah Muthersbaugh, PA-C  predniSONE (DELTASONE) 20 MG tablet Take 2 tablets (40 mg total) by mouth daily. Patient not taking: Reported on 06/02/2014 04/04/14   Teressa LowerVrinda Pickering, NP   BP 126/85 mmHg  Pulse 88  Temp(Src) 98.4 F (36.9 C) (Oral)  Resp 20  SpO2 99%  LMP 04/25/2014 Physical Exam  Constitutional: She is oriented to person, place, and time. She appears well-developed and well-nourished. No distress.  HENT:  Head: Normocephalic and atraumatic.  Second molar on left lower jaw has obvious fracture to anterior aspect of the tooth.  No pulp or exposed root noted.   Eyes: Conjunctivae and EOM are normal.  Neck: Neck supple. No tracheal deviation present.  Cardiovascular: Normal rate.   Pulmonary/Chest: Effort normal. No respiratory distress.  Musculoskeletal: Normal range of motion.  Neurological: She is alert and oriented to person, place, and time.  Skin: Skin is warm and dry.  Psychiatric: She has a normal mood and affect. Her behavior is normal.  Nursing note  and vitals reviewed.   ED Course  Procedures (including critical care time)  DIAGNOSTIC STUDIES: Oxygen Saturation is 99% on RA, normal by my interpretation.    COORDINATION OF CARE:  11:47 PM Discussed treatment plan with patient at bedside.  Patient acknowledges and agrees with plan.    Labs Review Labs Reviewed - No data to display  Imaging Review No results found.   EKG Interpretation None      MDM   Final diagnoses:  Pain, dental   29 yo with ongoing tooth pain. Her exam is not  concerning for gross abscess or spread of infection. Pain resolved after dental block. Will treat with clindamycin and tramadol and resources provided for dental follow-up. Pt aware of plan and in agreement.   I personally performed the services described in this documentation, which was scribed in my presence. The recorded information has been reviewed and is accurate.  Filed Vitals:   07/01/14 2320 07/02/14 0053  BP: 126/85 109/65  Pulse: 88 95  Temp: 98.4 F (36.9 C) 98.5 F (36.9 C)  TempSrc: Oral Oral  Resp: 20 22  SpO2: 99% 99%   Meds given in ED:  Medications  bupivacaine-epinephrine (MARCAINE W/ EPI) 0.5% -1:200000 injection 1.8 mL (1.8 mLs Infiltration Given by Other 07/01/14 2359)  naproxen (NAPROSYN) tablet 500 mg (500 mg Oral Given 07/02/14 0018)  traMADol (ULTRAM) tablet 50 mg (50 mg Oral Given 07/02/14 0018)  bupivacaine-epinephrine (MARCAINE W/ EPI) 0.5% -1:200000 injection 1.8 mL (1.8 mLs Infiltration Given by Other 07/02/14 0017)    Discharge Medication List as of 07/02/2014 12:16 AM    START taking these medications   Details  naproxen (NAPROSYN) 500 MG tablet Take 1 tablet (500 mg total) by mouth 2 (two) times daily., Starting 07/02/2014, Until Discontinued, Print    traMADol (ULTRAM) 50 MG tablet Take 1 tablet (50 mg total) by mouth every 6 (six) hours as needed., Starting 07/02/2014, Until Discontinued, Print          Harle Battiest, NP 07/03/14 1200  Derwood Kaplan, MD 07/04/14 0000

## 2014-07-02 MED ORDER — NAPROXEN 500 MG PO TABS: 500.0000 mg | ORAL_TABLET | Freq: Two times a day (BID) | ORAL | Status: DC

## 2014-07-02 MED ORDER — BUPIVACAINE-EPINEPHRINE (PF) 0.5% -1:200000 IJ SOLN
1.8000 mL | Freq: Once | INTRAMUSCULAR | Status: AC
  Administered 2014-07-02: 1.8 mL
  Filled 2014-07-02: qty 1.8

## 2014-07-02 MED ORDER — TRAMADOL HCL 50 MG PO TABS: 50.0000 mg | ORAL_TABLET | Freq: Four times a day (QID) | ORAL | Status: DC | PRN

## 2014-07-02 MED ORDER — CLINDAMYCIN HCL 150 MG PO CAPS: 300.0000 mg | ORAL_CAPSULE | Freq: Three times a day (TID) | ORAL | Status: DC

## 2014-07-02 MED ORDER — TRAMADOL HCL 50 MG PO TABS
50.0000 mg | ORAL_TABLET | Freq: Once | ORAL | Status: AC
  Administered 2014-07-02: 50 mg via ORAL
  Filled 2014-07-02: qty 1

## 2014-07-02 MED ORDER — NAPROXEN 250 MG PO TABS
500.0000 mg | ORAL_TABLET | Freq: Once | ORAL | Status: AC
  Administered 2014-07-02: 500 mg via ORAL
  Filled 2014-07-02: qty 2

## 2014-07-02 NOTE — ED Notes (Signed)
Pt c/o left lower dental pain x 7 months.

## 2014-07-02 NOTE — Discharge Instructions (Signed)
Please follow the directions provided. It is very important follow-up with the dentist provided for management of this tooth pain. Please take the naproxen twice a day to help with pain and inflammation. May take the tramadol for pain not relieved by the naproxen. Please take your antibiotic until it all gone. Don't hesitate to return for any new, worsening, or concerning symptoms.   SEEK IMMEDIATE MEDICAL CARE IF:  You have a fever.  You develop redness and swelling of your face, jaw, or neck.  You are unable to open your mouth.  You have severe pain uncontrolled by pain medicine.

## 2014-08-29 ENCOUNTER — Encounter (HOSPITAL_BASED_OUTPATIENT_CLINIC_OR_DEPARTMENT_OTHER): Payer: Self-pay | Admitting: Emergency Medicine

## 2014-08-29 ENCOUNTER — Emergency Department (HOSPITAL_BASED_OUTPATIENT_CLINIC_OR_DEPARTMENT_OTHER)
Admission: EM | Admit: 2014-08-29 | Discharge: 2014-08-30 | Disposition: A | Discharge status: Home / Self Care | Payer: BLUE CROSS/BLUE SHIELD | Attending: Emergency Medicine | Admitting: Emergency Medicine

## 2014-08-29 DIAGNOSIS — Z720 Tobacco use: Secondary | ICD-10-CM | POA: Diagnosis not present

## 2014-08-29 DIAGNOSIS — Z791 Long term (current) use of non-steroidal anti-inflammatories (NSAID): Secondary | ICD-10-CM | POA: Insufficient documentation

## 2014-08-29 DIAGNOSIS — S63501A Unspecified sprain of right wrist, initial encounter: Secondary | ICD-10-CM | POA: Diagnosis not present

## 2014-08-29 DIAGNOSIS — Y998 Other external cause status: Secondary | ICD-10-CM | POA: Diagnosis not present

## 2014-08-29 DIAGNOSIS — S3992XA Unspecified injury of lower back, initial encounter: Secondary | ICD-10-CM | POA: Insufficient documentation

## 2014-08-29 DIAGNOSIS — J45909 Unspecified asthma, uncomplicated: Secondary | ICD-10-CM | POA: Diagnosis not present

## 2014-08-29 DIAGNOSIS — Y92410 Unspecified street and highway as the place of occurrence of the external cause: Secondary | ICD-10-CM | POA: Insufficient documentation

## 2014-08-29 DIAGNOSIS — S161XXA Strain of muscle, fascia and tendon at neck level, initial encounter: Secondary | ICD-10-CM | POA: Insufficient documentation

## 2014-08-29 DIAGNOSIS — S80211A Abrasion, right knee, initial encounter: Secondary | ICD-10-CM | POA: Diagnosis not present

## 2014-08-29 DIAGNOSIS — Z7952 Long term (current) use of systemic steroids: Secondary | ICD-10-CM | POA: Insufficient documentation

## 2014-08-29 DIAGNOSIS — Z880 Allergy status to penicillin: Secondary | ICD-10-CM | POA: Diagnosis not present

## 2014-08-29 DIAGNOSIS — S199XXA Unspecified injury of neck, initial encounter: Secondary | ICD-10-CM | POA: Diagnosis present

## 2014-08-29 DIAGNOSIS — Y9389 Activity, other specified: Secondary | ICD-10-CM | POA: Diagnosis not present

## 2014-08-29 DIAGNOSIS — Z79899 Other long term (current) drug therapy: Secondary | ICD-10-CM | POA: Diagnosis not present

## 2014-08-29 MED ORDER — IBUPROFEN 400 MG PO TABS
600.0000 mg | ORAL_TABLET | Freq: Once | ORAL | Status: AC
  Administered 2014-08-29: 600 mg via ORAL
  Filled 2014-08-29 (×2): qty 1

## 2014-08-29 MED ORDER — METHOCARBAMOL 500 MG PO TABS
1000.0000 mg | ORAL_TABLET | Freq: Once | ORAL | Status: AC
  Administered 2014-08-29: 1000 mg via ORAL
  Filled 2014-08-29: qty 2
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
## 2014-08-30 MED ORDER — METHOCARBAMOL 500 MG PO TABS: 1000.0000 mg | ORAL_TABLET | Freq: Three times a day (TID) | ORAL | Status: DC | PRN

## 2014-08-30 MED ORDER — IBUPROFEN 600 MG PO TABS: 600.0000 mg | ORAL_TABLET | Freq: Four times a day (QID) | ORAL | Status: DC | PRN

## 2014-08-30 NOTE — Discharge Instructions (Signed)
Muscle Strain A muscle strain is an injury that occurs when a muscle is stretched beyond its normal length. Usually a small number of muscle fibers are torn when this happens. Muscle strain is rated in degrees. First-degree strains have the least amount of muscle fiber tearing and pain. Second-degree and third-degree strains have increasingly more tearing and pain.  Usually, recovery from muscle strain takes 1-2 weeks. Complete healing takes 5-6 weeks.  CAUSES  Muscle strain happens when a sudden, violent force placed on a muscle stretches it too far. This may occur with lifting, sports, or a fall.  RISK FACTORS Muscle strain is especially common in athletes.  SIGNS AND SYMPTOMS At the site of the muscle strain, there may be:  Pain.  Bruising.  Swelling.  Difficulty using the muscle due to pain or lack of normal function. DIAGNOSIS  Your health care provider will perform a physical exam and ask about your medical history. TREATMENT  Often, the best treatment for a muscle strain is resting, icing, and applying cold compresses to the injured area.  HOME CARE INSTRUCTIONS   Use the PRICE method of treatment to promote muscle healing during the first 2-3 days after your injury. The PRICE method involves:  Protecting the muscle from being injured again.  Restricting your activity and resting the injured body part.  Icing your injury. To do this, put ice in a plastic bag. Place a towel between your skin and the bag. Then, apply the ice and leave it on from 15-20 minutes each hour. After the third day, switch to moist heat packs.  Apply compression to the injured area with a splint or elastic bandage. Be careful not to wrap it too tightly. This may interfere with blood circulation or increase swelling.  Elevate the injured body part above the level of your heart as often as you can.  Only take over-the-counter or prescription medicines for pain, discomfort, or fever as directed by your  health care provider.  Warming up prior to exercise helps to prevent future muscle strains. SEEK MEDICAL CARE IF:   You have increasing pain or swelling in the injured area.  You have numbness, tingling, or a significant loss of strength in the injured area. MAKE SURE YOU:   Understand these instructions.  Will watch your condition.  Will get help right away if you are not doing well or get worse. Document Released: 03/21/2005 Document Revised: 01/09/2013 Document Reviewed: 10/18/2012 [facility] Patient Information 2015 [place], [place]. This information is not intended to replace advice given to you by your health care provider. Make sure you discuss any questions you have with your health care provider.  Joint Sprain A sprain is a tear or stretch in the ligaments that hold a joint together. Severe sprains may need as long as 3-6 weeks of immobilization and/or exercises to heal completely. Sprained joints should be rested and protected. If not, they can become unstable and prone to re-injury. Proper treatment can reduce your pain, shorten the period of disability, and reduce the risk of repeated injuries. TREATMENT   Rest and elevate the injured joint to reduce pain and swelling.  Apply ice packs to the injury for 20-30 minutes every 2-3 hours for the next 2-3 days.  Keep the injury wrapped in a compression bandage or splint as long as the joint is painful or as instructed by your caregiver.  Do not use the injured joint until it is completely healed to prevent re-injury and chronic instability. Follow the instructions  of your caregiver.  Long-term sprain management may require exercises and/or treatment by a physical therapist. Taping or special braces may help stabilize the joint until it is completely better. SEEK MEDICAL CARE IF:   You develop increased pain or swelling of the joint.  You develop increasing redness and warmth of the joint.  You develop a fever.  It becomes  stiff.  Your hand or foot gets cold or numb. Document Released: 04/28/2004 Document Revised: 06/13/2011 Document Reviewed: 04/07/2008 [facility] Patient Information 2015 [place], [place]. This information is not intended to replace advice given to you by your health care provider. Make sure you discuss any questions you have with your health care provider.  Motor Vehicle Collision It is common to have multiple bruises and sore muscles after a motor vehicle collision (MVC). These tend to feel worse for the first 24 hours. You may have the most stiffness and soreness over the first several hours. You may also feel worse when you wake up the first morning after your collision. After this point, you will usually begin to improve with each day. The speed of improvement often depends on the severity of the collision, the number of injuries, and the location and nature of these injuries. HOME CARE INSTRUCTIONS  Put ice on the injured area.  Put ice in a plastic bag.  Place a towel between your skin and the bag.  Leave the ice on for 15-20 minutes, 3-4 times a day, or as directed by your health care provider.  Drink enough fluids to keep your urine clear or pale yellow. Do not drink alcohol.  Take a warm shower or bath once or twice a day. This will increase blood flow to sore muscles.  You may return to activities as directed by your caregiver. Be careful when lifting, as this may aggravate neck or back pain.  Only take over-the-counter or prescription medicines for pain, discomfort, or fever as directed by your caregiver. Do not use aspirin. This may increase bruising and bleeding. SEEK IMMEDIATE MEDICAL CARE IF:  You have numbness, tingling, or weakness in the arms or legs.  You develop severe headaches not relieved with medicine.  You have severe neck pain, especially tenderness in the middle of the back of your neck.  You have changes in bowel or bladder control.  There is increasing  pain in any area of the body.  You have shortness of breath, light-headedness, dizziness, or fainting.  You have chest pain.  You feel sick to your stomach (nauseous), throw up (vomit), or sweat.  You have increasing abdominal discomfort.  There is blood in your urine, stool, or vomit.  You have pain in your shoulder (shoulder strap areas).  You feel your symptoms are getting worse. MAKE SURE YOU:  Understand these instructions.  Will watch your condition.  Will get help right away if you are not doing well or get worse. Document Released: 03/21/2005 Document Revised: 08/05/2013 Document Reviewed: 08/18/2010 [facility] Patient Information 2015 [place], [place]. This information is not intended to replace advice given to you by your health care provider. Make sure you discuss any questions you have with your health care provider.

## 2014-09-17 ENCOUNTER — Emergency Department (HOSPITAL_BASED_OUTPATIENT_CLINIC_OR_DEPARTMENT_OTHER)
Admission: EM | Admit: 2014-09-17 | Discharge: 2014-09-17 | Disposition: A | Discharge status: Home / Self Care | Payer: BLUE CROSS/BLUE SHIELD | Attending: Emergency Medicine | Admitting: Emergency Medicine

## 2014-09-17 ENCOUNTER — Encounter (HOSPITAL_BASED_OUTPATIENT_CLINIC_OR_DEPARTMENT_OTHER): Payer: Self-pay | Admitting: *Deleted

## 2014-09-17 DIAGNOSIS — J45909 Unspecified asthma, uncomplicated: Secondary | ICD-10-CM | POA: Diagnosis not present

## 2014-09-17 DIAGNOSIS — Z791 Long term (current) use of non-steroidal anti-inflammatories (NSAID): Secondary | ICD-10-CM | POA: Diagnosis not present

## 2014-09-17 DIAGNOSIS — K088 Other specified disorders of teeth and supporting structures: Secondary | ICD-10-CM | POA: Insufficient documentation

## 2014-09-17 DIAGNOSIS — Z79899 Other long term (current) drug therapy: Secondary | ICD-10-CM | POA: Diagnosis not present

## 2014-09-17 DIAGNOSIS — K0889 Other specified disorders of teeth and supporting structures: Secondary | ICD-10-CM

## 2014-09-17 DIAGNOSIS — Z720 Tobacco use: Secondary | ICD-10-CM | POA: Diagnosis not present

## 2014-09-17 DIAGNOSIS — Z7952 Long term (current) use of systemic steroids: Secondary | ICD-10-CM | POA: Diagnosis not present

## 2014-09-17 MED ORDER — CLINDAMYCIN HCL 300 MG PO CAPS: 300.0000 mg | ORAL_CAPSULE | Freq: Four times a day (QID) | ORAL | Status: DC

## 2014-09-17 NOTE — Discharge Instructions (Signed)
Take clindamycin as directed. Follow-up with one of the dentist attached. You may apply over-the-counter Orajel to the area that is painful.  Dental Pain A tooth ache may be caused by cavities (tooth decay). Cavities expose the nerve of the tooth to air and hot or cold temperatures. It may come from an infection or abscess (also called a boil or furuncle) around your tooth. It is also often caused by dental caries (tooth decay). This causes the pain you are having. DIAGNOSIS  Your caregiver can diagnose this problem by exam. TREATMENT   If caused by an infection, it may be treated with medications which kill germs (antibiotics) and pain medications as prescribed by your caregiver. Take medications as directed.  Only take over-the-counter or prescription medicines for pain, discomfort, or fever as directed by your caregiver.  Whether the tooth ache today is caused by infection or dental disease, you should see your dentist as soon as possible for further care. SEEK MEDICAL CARE IF: The exam and treatment you received today has been provided on an emergency basis only. This is not a substitute for complete medical or dental care. If your problem worsens or new problems (symptoms) appear, and you are unable to meet with your dentist, call or return to this location. SEEK IMMEDIATE MEDICAL CARE IF:   You have a fever.  You develop redness and swelling of your face, jaw, or neck.  You are unable to open your mouth.  You have severe pain uncontrolled by pain medicine. MAKE SURE YOU:   Understand these instructions.  Will watch your condition.  Will get help right away if you are not doing well or get worse. Document Released: 03/21/2005 Document Revised: 06/13/2011 Document Reviewed: 11/07/2007 [facility] Patient Information 2015 [place], [place]. This information is not intended to replace advice given to you by your health care provider. Make sure you discuss any questions you have with your  health care provider.

## 2014-09-17 NOTE — ED Provider Notes (Signed)
CSN: [id number]     Arrival date & time 09/17/14  1810 History   First MD Initiated Contact with Patient 09/17/14 1822     Chief Complaint  Patient presents with  . Dental Pain     (Consider location/radiation/quality/duration/timing/severity/associated sxs/prior Treatment) HPI Comments: 29 year old female complaining of left-sided dental pain 5 days. States the pain is located in her upper back teeth, and she feels like there is a hole there and her wisdom tooth is about to "bust through". States she believes she may have bit her cheek as it is very painful and starting to get swollen. Pain worse with chewing, radiating towards her lower teeth. She has not tried any alleviating factors for her symptoms. Does not have a dentist at this time. No fevers.  Patient is a 29 y.o. female presenting with tooth pain. The history is provided by the patient.  Dental Pain Associated symptoms: facial swelling   Associated symptoms: no fever     Past Medical History  Diagnosis Date  . Asthma    History reviewed. No pertinent past surgical history. History reviewed. No pertinent family history. History  Substance Use Topics  . Smoking status: Current Every Day Smoker -- 0.50 packs/day    Types: Cigarettes  . Smokeless tobacco: Not on file  . Alcohol Use: Yes     Comment: socially   OB History    No data available     Review of Systems  Constitutional: Negative for fever.  HENT: Positive for dental problem and facial swelling.   Respiratory: Negative for apnea and shortness of breath.   Hematological: Negative for adenopathy.      Allergies  Hydrocodone; Morphine and related; Penicillins; and Percocet  Home Medications   Prior to Admission medications   Medication Sig Start Date End Date Taking? Authorizing Provider  acetaminophen (TYLENOL) 500 MG tablet Take 500 mg by mouth every 6 (six) hours as needed for moderate pain.    Historical Provider, MD  albuterol (PROVENTIL) (2.5  MG/3ML) 0.083% nebulizer solution Take 3 mLs (2.5 mg total) by nebulization every 6 (six) hours as needed for wheezing or shortness of breath. Patient not taking: Reported on 07/01/2014 04/04/14   Teressa Lower, NP  clindamycin (CLEOCIN) 300 MG capsule Take 1 capsule (300 mg total) by mouth 4 (four) times daily. X 7 days 09/17/14   Kathrynn Speed, PA-C  ibuprofen (ADVIL,MOTRIN) 600 MG tablet Take 1 tablet (600 mg total) by mouth every 6 (six) hours as needed. 08/30/14   Loren Racer, MD  methocarbamol (ROBAXIN) 500 MG tablet Take 2 tablets (1,000 mg total) by mouth every 8 (eight) hours as needed for muscle spasms. 08/30/14   Loren Racer, MD  naproxen (NAPROSYN) 500 MG tablet Take 1 tablet (500 mg total) by mouth 2 (two) times daily. 07/02/14   Harle Battiest, NP  oxyCODONE-acetaminophen (PERCOCET) 5-325 MG per tablet Take 1 tablet by mouth every 4 (four) hours as needed. Patient not taking: Reported on 07/01/2014 06/02/14   Dahlia Client Muthersbaugh, PA-C  predniSONE (DELTASONE) 20 MG tablet Take 2 tablets (40 mg total) by mouth daily. Patient not taking: Reported on 06/02/2014 04/04/14   Teressa Lower, NP  traMADol (ULTRAM) 50 MG tablet Take 1 tablet (50 mg total) by mouth every 6 (six) hours as needed. 07/02/14   Harle Battiest, NP   BP 122/76 mmHg  Pulse 74  Temp(Src) 98.3 F (36.8 C) (Oral)  Resp 18  Ht 5\' 3"  (1.6 m)  Wt 158 lb 8 oz (  71.895 kg)  BMI 28.08 kg/m2  SpO2 100%  LMP 09/10/2014 Physical Exam  Constitutional: She is oriented to person, place, and time. She appears well-developed and well-nourished. No distress.  HENT:  Head: Normocephalic and atraumatic.  Mouth/Throat: Oropharynx is clear and moist.    TTP left lateral maxilla with 2 mm round, palpable are of induration that may be developing abscess.  Eyes: Conjunctivae and EOM are normal.  Neck: Normal range of motion. Neck supple.  Cardiovascular: Normal rate, regular rhythm and normal heart sounds.     Pulmonary/Chest: Effort normal and breath sounds normal. No respiratory distress.  Musculoskeletal: Normal range of motion. She exhibits no edema.  Neurological: She is alert and oriented to person, place, and time. No sensory deficit.  Skin: Skin is warm and dry.  Psychiatric: She has a normal mood and affect. Her behavior is normal.  Nursing note and vitals reviewed.   ED Course  Procedures (including critical care time) Labs Review Labs Reviewed - No data to display  Imaging Review No results found.   EKG Interpretation None      MDM   Final diagnoses:  Pain, dental   NAD. AFVSS. Area on L maxilla most likely developing abscess. No visible abscess. No s/s Ludwig angina. Will treat with clinda. NSAIDs for pain. Advised f/u with dentist- resources given. Stable for d/c. Return precautions given. Patient states understanding of treatment care plan and is agreeable.  Kathrynn Speed, PA-C 09/17/14 1851  Jerelyn Scott, MD 09/17/14 [phone]

## 2014-09-17 NOTE — ED Notes (Signed)
Pt c/o dental pain x 5 days 

## 2015-07-18 ENCOUNTER — Emergency Department (HOSPITAL_COMMUNITY): Payer: Self-pay

## 2015-07-18 ENCOUNTER — Emergency Department (HOSPITAL_COMMUNITY): Payer: No Typology Code available for payment source

## 2015-07-18 ENCOUNTER — Emergency Department (HOSPITAL_COMMUNITY)
Admission: EM | Admit: 2015-07-18 | Discharge: 2015-07-18 | Disposition: A | Discharge status: Home / Self Care | Payer: No Typology Code available for payment source | Attending: Emergency Medicine | Admitting: Emergency Medicine

## 2015-07-18 ENCOUNTER — Encounter (HOSPITAL_COMMUNITY): Payer: Self-pay

## 2015-07-18 DIAGNOSIS — S8992XA Unspecified injury of left lower leg, initial encounter: Secondary | ICD-10-CM | POA: Insufficient documentation

## 2015-07-18 DIAGNOSIS — S0990XA Unspecified injury of head, initial encounter: Secondary | ICD-10-CM | POA: Insufficient documentation

## 2015-07-18 DIAGNOSIS — J45909 Unspecified asthma, uncomplicated: Secondary | ICD-10-CM | POA: Insufficient documentation

## 2015-07-18 DIAGNOSIS — Y92410 Unspecified street and highway as the place of occurrence of the external cause: Secondary | ICD-10-CM | POA: Insufficient documentation

## 2015-07-18 DIAGNOSIS — F17210 Nicotine dependence, cigarettes, uncomplicated: Secondary | ICD-10-CM | POA: Insufficient documentation

## 2015-07-18 DIAGNOSIS — F10920 Alcohol use, unspecified with intoxication, uncomplicated: Secondary | ICD-10-CM

## 2015-07-18 DIAGNOSIS — Z880 Allergy status to penicillin: Secondary | ICD-10-CM | POA: Insufficient documentation

## 2015-07-18 DIAGNOSIS — Y998 Other external cause status: Secondary | ICD-10-CM | POA: Insufficient documentation

## 2015-07-18 DIAGNOSIS — S0083XA Contusion of other part of head, initial encounter: Secondary | ICD-10-CM | POA: Insufficient documentation

## 2015-07-18 DIAGNOSIS — R52 Pain, unspecified: Secondary | ICD-10-CM

## 2015-07-18 DIAGNOSIS — T1490XA Injury, unspecified, initial encounter: Secondary | ICD-10-CM

## 2015-07-18 DIAGNOSIS — Z3202 Encounter for pregnancy test, result negative: Secondary | ICD-10-CM | POA: Insufficient documentation

## 2015-07-18 DIAGNOSIS — F10120 Alcohol abuse with intoxication, uncomplicated: Secondary | ICD-10-CM | POA: Insufficient documentation

## 2015-07-18 DIAGNOSIS — S8991XA Unspecified injury of right lower leg, initial encounter: Secondary | ICD-10-CM | POA: Insufficient documentation

## 2015-07-18 DIAGNOSIS — I10 Essential (primary) hypertension: Secondary | ICD-10-CM | POA: Insufficient documentation

## 2015-07-18 DIAGNOSIS — Y9389 Activity, other specified: Secondary | ICD-10-CM | POA: Insufficient documentation

## 2015-07-18 HISTORY — DX: Essential (primary) hypertension: I10

## 2015-07-18 LAB — PROTIME-INR
INR: 0.96 (ref 0.00–1.49)
PROTHROMBIN TIME: 13 s (ref 11.6–15.2)

## 2015-07-18 LAB — COMPREHENSIVE METABOLIC PANEL
ALT: 41 U/L (ref 14–54)
ANION GAP: 12 (ref 5–15)
AST: 68 U/L — ABNORMAL HIGH (ref 15–41)
Albumin: 4.3 g/dL (ref 3.5–5.0)
Alkaline Phosphatase: 68 U/L (ref 38–126)
BUN: <5 mg/dL — ABNORMAL LOW (ref 6–20)
CALCIUM: 9.5 mg/dL (ref 8.9–10.3)
CO2: 23 mmol/L (ref 22–32)
Chloride: 106 mmol/L (ref 101–111)
Creatinine, Ser: 0.8 mg/dL (ref 0.44–1.00)
GFR calc non Af Amer: >60 mL/min (ref >60)
Glucose, Bld: 100 mg/dL — ABNORMAL HIGH (ref 65–99)
Potassium: 4.5 mmol/L (ref 3.5–5.1)
SODIUM: 141 mmol/L (ref 135–145)
Total Bilirubin: 0.6 mg/dL (ref 0.3–1.2)
Total Protein: 7.9 g/dL (ref 6.5–8.1)

## 2015-07-18 LAB — CBC
HCT: 38.8 % (ref 36.0–46.0)
HEMOGLOBIN: 13.2 g/dL (ref 12.0–15.0)
MCH: 30.3 pg (ref 26.0–34.0)
MCHC: 34 g/dL (ref 30.0–36.0)
MCV: 89.2 fL (ref 78.0–100.0)
Platelets: 297 10*3/uL (ref 150–400)
RBC: 4.35 MIL/uL (ref 3.87–5.11)
RDW: 13.2 % (ref 11.5–15.5)
WBC: 9.7 10*3/uL (ref 4.0–10.5)

## 2015-07-18 LAB — I-STAT BETA HCG BLOOD, ED (MC, WL, AP ONLY)

## 2015-07-18 LAB — SAMPLE TO BLOOD BANK

## 2015-07-18 LAB — ETHANOL: ALCOHOL ETHYL (B): 309 mg/dL — AB (ref <5)

## 2015-07-18 LAB — CDS SEROLOGY

## 2015-07-18 MED ORDER — NAPROXEN 500 MG PO TABS: 500.0000 mg | ORAL_TABLET | Freq: Two times a day (BID) | ORAL | Status: DC

## 2015-07-18 MED ORDER — SODIUM CHLORIDE 0.9 % IV BOLUS (SEPSIS)
1000.0000 mL | Freq: Once | INTRAVENOUS | Status: AC
  Administered 2015-07-18: 1000 mL via INTRAVENOUS

## 2015-07-18 NOTE — ED Notes (Signed)
Pt tolerated food, water, and ambulation independently.

## 2015-07-18 NOTE — ED Notes (Signed)
MD at bedside. 

## 2015-07-18 NOTE — ED Notes (Signed)
Patient took cervical collar off at this time.
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
## 2015-07-18 NOTE — ED Notes (Signed)
C collar removed by pt, pt refuses to replace collar, pt states she does not care about the consequences, pt refusing blood work.

## 2015-07-18 NOTE — Discharge Instructions (Signed)
Alcohol Intoxication  Alcohol intoxication occurs when the amount of alcohol that a person has consumed impairs his or her ability to mentally and physically function. Alcohol directly impairs the normal chemical activity of the brain. Drinking large amounts of alcohol can lead to changes in mental function and behavior, and it can cause many physical effects that can be harmful.   Alcohol intoxication can range in severity from mild to very severe. Various factors can affect the level of intoxication that occurs, such as the person's age, gender, weight, frequency of alcohol consumption, and the presence of other medical conditions (such as diabetes, seizures, or heart conditions). Dangerous levels of alcohol intoxication may occur when people drink large amounts of alcohol in a short period (binge drinking). Alcohol can also be especially dangerous when combined with certain prescription medicines or "recreational" drugs.  SIGNS AND SYMPTOMS  Some common signs and symptoms of mild alcohol intoxication include:  · Loss of coordination.  · Changes in mood and behavior.  · Impaired judgment.  · Slurred speech.  As alcohol intoxication progresses to more severe levels, other signs and symptoms will appear. These may include:  · Vomiting.  · Confusion and impaired memory.  · Slowed breathing.  · Seizures.  · Loss of consciousness.  DIAGNOSIS   Your health care provider will take a medical history and perform a physical exam. You will be asked about the amount and type of alcohol you have consumed. Blood tests will be done to measure the concentration of alcohol in your blood. In many places, your blood alcohol level must be lower than 80 mg/dL (0.08%) to legally drive. However, many dangerous effects of alcohol can occur at much lower levels.   TREATMENT   People with alcohol intoxication often do not require treatment. Most of the effects of alcohol intoxication are temporary, and they go away as the alcohol naturally  leaves the body. Your health care provider will monitor your condition until you are stable enough to go home. Fluids are sometimes given through an IV access tube to help prevent dehydration.   HOME CARE INSTRUCTIONS  · Do not drive after drinking alcohol.  · Stay hydrated. Drink enough water and fluids to keep your urine clear or pale yellow. Avoid caffeine.    · Only take over-the-counter or prescription medicines as directed by your health care provider.    SEEK MEDICAL CARE IF:   · You have persistent vomiting.    · You do not feel better after a few days.  · You have frequent alcohol intoxication. Your health care provider can help determine if you should see a substance use treatment counselor.  SEEK IMMEDIATE MEDICAL CARE IF:   · You become shaky or tremble when you try to stop drinking.    · You shake uncontrollably (seizure).    · You throw up (vomit) blood. This may be bright red or may look like black coffee grounds.    · You have blood in your stool. This may be bright red or may appear as a black, tarry, bad smelling stool.    · You become lightheaded or faint.    MAKE SURE YOU:   · Understand these instructions.  · Will watch your condition.  · Will get help right away if you are not doing well or get worse.     This information is not intended to replace advice given to you by your health care provider. Make sure you discuss any questions you have with your health care provider.       Document Released: 12/29/2004 Document Revised: 11/21/2012 Document Reviewed: 08/24/2012  Elsevier Interactive Patient Education ©2016 [employer]    Motor Vehicle Collision  It is common to have multiple bruises and sore muscles after a motor vehicle collision (MVC). These tend to feel worse for the first 24 hours. You may have the most stiffness and soreness over the first several hours. You may also feel worse when you wake up the first morning after your collision. After this point, you will usually begin to improve  with each day. The speed of improvement often depends on the severity of the collision, the number of injuries, and the location and nature of these injuries.  HOME CARE INSTRUCTIONS  · Put ice on the injured area.    Put ice in a plastic bag.    Place a towel between your skin and the bag.    Leave the ice on for 15-20 minutes, 3-4 times a day, or as directed by your health care provider.  · Drink enough fluids to keep your urine clear or pale yellow. Do not drink alcohol.  · Take a warm shower or bath once or twice a day. This will increase blood flow to sore muscles.  · You may return to activities as directed by your caregiver. Be careful when lifting, as this may aggravate neck or back pain.  · Only take over-the-counter or prescription medicines for pain, discomfort, or fever as directed by your caregiver. Do not use aspirin. This may increase bruising and bleeding.  SEEK IMMEDIATE MEDICAL CARE IF:  · You have numbness, tingling, or weakness in the arms or legs.  · You develop severe headaches not relieved with medicine.  · You have severe neck pain, especially tenderness in the middle of the back of your neck.  · You have changes in bowel or bladder control.  · There is increasing pain in any area of the body.  · You have shortness of breath, light-headedness, dizziness, or fainting.  · You have chest pain.  · You feel sick to your stomach (nauseous), throw up (vomit), or sweat.  · You have increasing abdominal discomfort.  · There is blood in your urine, stool, or vomit.  · You have pain in your shoulder (shoulder strap areas).  · You feel your symptoms are getting worse.  MAKE SURE YOU:  · Understand these instructions.  · Will watch your condition.  · Will get help right away if you are not doing well or get worse.     This information is not intended to replace advice given to you by your health care provider. Make sure you discuss any questions you have with your health care provider.     Document  Released: 03/21/2005 Document Revised: 04/11/2014 Document Reviewed: 08/18/2010  Elsevier Interactive Patient Education ©2016 [employer]

## 2015-07-18 NOTE — ED Notes (Signed)
[facility] Better[facility]anelle [phone]
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
## 2015-07-20 ENCOUNTER — Telehealth (HOSPITAL_BASED_OUTPATIENT_CLINIC_OR_DEPARTMENT_OTHER): Payer: Self-pay | Admitting: Emergency Medicine

## 2017-02-22 ENCOUNTER — Emergency Department (HOSPITAL_COMMUNITY): Payer: Self-pay

## 2017-02-22 ENCOUNTER — Encounter (HOSPITAL_COMMUNITY): Payer: Self-pay | Admitting: Emergency Medicine

## 2017-02-22 ENCOUNTER — Encounter (HOSPITAL_COMMUNITY)
Admission: EM | Disposition: A | Discharge status: Home / Self Care | Payer: Self-pay | Source: Home / Self Care | Attending: Cardiovascular Disease

## 2017-02-22 ENCOUNTER — Inpatient Hospital Stay (HOSPITAL_COMMUNITY)
Admission: EM | Admit: 2017-02-22 | Discharge: 2017-02-27 | DRG: 246 | Disposition: A | Discharge status: Home / Self Care | Payer: Self-pay | Attending: Cardiovascular Disease | Admitting: Cardiovascular Disease

## 2017-02-22 ENCOUNTER — Inpatient Hospital Stay (HOSPITAL_COMMUNITY): Payer: Self-pay

## 2017-02-22 ENCOUNTER — Encounter: Payer: Self-pay | Admitting: Internal Medicine

## 2017-02-22 DIAGNOSIS — Z8674 Personal history of sudden cardiac arrest: Secondary | ICD-10-CM

## 2017-02-22 DIAGNOSIS — D649 Anemia, unspecified: Secondary | ICD-10-CM | POA: Diagnosis present

## 2017-02-22 DIAGNOSIS — I2510 Atherosclerotic heart disease of native coronary artery without angina pectoris: Secondary | ICD-10-CM | POA: Diagnosis present

## 2017-02-22 DIAGNOSIS — I4901 Ventricular fibrillation: Secondary | ICD-10-CM | POA: Diagnosis present

## 2017-02-22 DIAGNOSIS — E785 Hyperlipidemia, unspecified: Secondary | ICD-10-CM | POA: Diagnosis present

## 2017-02-22 DIAGNOSIS — Z885 Allergy status to narcotic agent status: Secondary | ICD-10-CM

## 2017-02-22 DIAGNOSIS — E876 Hypokalemia: Secondary | ICD-10-CM | POA: Diagnosis present

## 2017-02-22 DIAGNOSIS — I213 ST elevation (STEMI) myocardial infarction of unspecified site: Secondary | ICD-10-CM

## 2017-02-22 DIAGNOSIS — I10 Essential (primary) hypertension: Secondary | ICD-10-CM | POA: Diagnosis present

## 2017-02-22 DIAGNOSIS — I2121 ST elevation (STEMI) myocardial infarction involving left circumflex coronary artery: Principal | ICD-10-CM | POA: Diagnosis present

## 2017-02-22 DIAGNOSIS — Z880 Allergy status to penicillin: Secondary | ICD-10-CM

## 2017-02-22 DIAGNOSIS — F17210 Nicotine dependence, cigarettes, uncomplicated: Secondary | ICD-10-CM | POA: Diagnosis present

## 2017-02-22 DIAGNOSIS — Z955 Presence of coronary angioplasty implant and graft: Secondary | ICD-10-CM

## 2017-02-22 DIAGNOSIS — I469 Cardiac arrest, cause unspecified: Secondary | ICD-10-CM

## 2017-02-22 DIAGNOSIS — I2542 Coronary artery dissection: Secondary | ICD-10-CM | POA: Diagnosis present

## 2017-02-22 HISTORY — DX: ST elevation (STEMI) myocardial infarction of unspecified site: I21.3

## 2017-02-22 HISTORY — DX: Anemia, unspecified: D64.9

## 2017-02-22 HISTORY — DX: Headache, unspecified: R51.9

## 2017-02-22 HISTORY — DX: Ventricular fibrillation: I49.01

## 2017-02-22 HISTORY — DX: Headache: R51

## 2017-02-22 HISTORY — PX: CORONARY ANGIOPLASTY WITH STENT PLACEMENT: SHX49

## 2017-02-22 HISTORY — PX: LEFT HEART CATH AND CORONARY ANGIOGRAPHY: CATH118249

## 2017-02-22 HISTORY — DX: Cardiac arrest, cause unspecified: I46.9

## 2017-02-22 HISTORY — PX: CORONARY/GRAFT ACUTE MI REVASCULARIZATION: CATH118305

## 2017-02-22 LAB — PROTIME-INR
INR: 1
Prothrombin Time: 13.1 seconds (ref 11.4–15.2)

## 2017-02-22 LAB — I-STAT CHEM 8, ED
BUN: 8 mg/dL (ref 6–20)
CREATININE: 0.8 mg/dL (ref 0.44–1.00)
Calcium, Ion: 1.14 mmol/L — ABNORMAL LOW (ref 1.15–1.40)
Chloride: 104 mmol/L (ref 101–111)
GLUCOSE: 116 mg/dL — AB (ref 65–99)
HEMATOCRIT: 45 % (ref 36.0–46.0)
HEMOGLOBIN: 15.3 g/dL — AB (ref 12.0–15.0)
Potassium: 3.5 mmol/L (ref 3.5–5.1)
Sodium: 141 mmol/L (ref 135–145)
TCO2: 24 mmol/L (ref 22–32)

## 2017-02-22 LAB — COMPREHENSIVE METABOLIC PANEL
ALK PHOS: 64 U/L (ref 38–126)
ALT: 19 U/L (ref 14–54)
AST: 39 U/L (ref 15–41)
Albumin: 5 g/dL (ref 3.5–5.0)
Anion gap: 15 (ref 5–15)
BUN: 10 mg/dL (ref 6–20)
CO2: 23 mmol/L (ref 22–32)
CREATININE: 0.91 mg/dL (ref 0.44–1.00)
Calcium: 10.1 mg/dL (ref 8.9–10.3)
Chloride: 100 mmol/L — ABNORMAL LOW (ref 101–111)
Glucose, Bld: 118 mg/dL — ABNORMAL HIGH (ref 65–99)
Potassium: 3.4 mmol/L — ABNORMAL LOW (ref 3.5–5.1)
Sodium: 138 mmol/L (ref 135–145)
Total Bilirubin: 1.1 mg/dL (ref 0.3–1.2)
Total Protein: 8.9 g/dL — ABNORMAL HIGH (ref 6.5–8.1)

## 2017-02-22 LAB — I-STAT TROPONIN, ED: TROPONIN I, POC: 0.06 ng/mL (ref 0.00–0.08)

## 2017-02-22 LAB — LIPID PANEL
CHOL/HDL RATIO: 2.3 ratio
Cholesterol: 192 mg/dL (ref 0–200)
HDL: 82 mg/dL (ref >40)
LDL CALC: 87 mg/dL (ref 0–99)
TRIGLYCERIDES: 115 mg/dL (ref <150)
VLDL: 23 mg/dL (ref 0–40)

## 2017-02-22 LAB — CBC WITH DIFFERENTIAL/PLATELET
BASOS ABS: 0 10*3/uL (ref 0.0–0.1)
Basophils Relative: 0 %
EOS PCT: 0 %
Eosinophils Absolute: 0 10*3/uL (ref 0.0–0.7)
HEMATOCRIT: 40.4 % (ref 36.0–46.0)
Hemoglobin: 14 g/dL (ref 12.0–15.0)
LYMPHS ABS: 2 10*3/uL (ref 0.7–4.0)
LYMPHS PCT: 16 %
MCH: 31 pg (ref 26.0–34.0)
MCHC: 34.7 g/dL (ref 30.0–36.0)
MCV: 89.4 fL (ref 78.0–100.0)
MONO ABS: 0.4 10*3/uL (ref 0.1–1.0)
MONOS PCT: 3 %
NEUTROS ABS: 10 10*3/uL — AB (ref 1.7–7.7)
Neutrophils Relative %: 81 %
PLATELETS: 397 10*3/uL (ref 150–400)
RBC: 4.52 MIL/uL (ref 3.87–5.11)
RDW: 13.7 % (ref 11.5–15.5)
WBC: 12.5 10*3/uL — ABNORMAL HIGH (ref 4.0–10.5)

## 2017-02-22 LAB — TROPONIN I
Troponin I: 0.06 ng/mL (ref <0.03)
Troponin I: 0.54 ng/mL (ref <0.03)

## 2017-02-22 LAB — LIPASE, BLOOD: Lipase: 18 U/L (ref 11–51)

## 2017-02-22 LAB — I-STAT BETA HCG BLOOD, ED (MC, WL, AP ONLY)

## 2017-02-22 LAB — APTT: APTT: 26 s (ref 24–36)

## 2017-02-22 LAB — MRSA PCR SCREENING: MRSA by PCR: NEGATIVE

## 2017-02-22 SURGERY — LEFT HEART CATH AND CORONARY ANGIOGRAPHY
Anesthesia: LOCAL

## 2017-02-22 SURGERY — CORONARY/GRAFT ACUTE MI REVASCULARIZATION
Anesthesia: LOCAL

## 2017-02-22 MED ORDER — ASPIRIN 81 MG PO CHEW
324.0000 mg | CHEWABLE_TABLET | Freq: Once | ORAL | Status: AC
  Administered 2017-02-22: 324 mg via ORAL
  Filled 2017-02-22: qty 4

## 2017-02-22 MED ORDER — SODIUM CHLORIDE 0.9 % IV SOLN
INTRAVENOUS | Status: DC
  Administered 2017-02-22 – 2017-02-23 (×2): via INTRAVENOUS

## 2017-02-22 MED ORDER — SODIUM CHLORIDE 0.9% FLUSH
3.0000 mL | Freq: Two times a day (BID) | INTRAVENOUS | Status: DC
  Administered 2017-02-23 – 2017-02-26 (×7): 3 mL via INTRAVENOUS

## 2017-02-22 MED ORDER — BIVALIRUDIN TRIFLUOROACETATE 250 MG IV SOLR
INTRAVENOUS | Status: AC
  Filled 2017-02-22: qty 250

## 2017-02-22 MED ORDER — ONDANSETRON HCL 4 MG/2ML IJ SOLN: 4.0000 mg | Freq: Four times a day (QID) | INTRAMUSCULAR | Status: DC | PRN

## 2017-02-22 MED ORDER — BIVALIRUDIN BOLUS VIA INFUSION - CUPID
INTRAVENOUS | Status: DC | PRN
  Administered 2017-02-22: 49.35 mg via INTRAVENOUS

## 2017-02-22 MED ORDER — AMIODARONE HCL IN DEXTROSE 360-4.14 MG/200ML-% IV SOLN: 60.0000 mg/h | Freq: Once | INTRAVENOUS | Status: DC

## 2017-02-22 MED ORDER — MIDAZOLAM HCL 2 MG/2ML IJ SOLN
INTRAMUSCULAR | Status: AC
  Filled 2017-02-22: qty 2

## 2017-02-22 MED ORDER — ASPIRIN 81 MG PO CHEW
81.0000 mg | CHEWABLE_TABLET | Freq: Every day | ORAL | Status: DC
  Administered 2017-02-23 – 2017-02-27 (×5): 81 mg via ORAL
  Filled 2017-02-22 (×5): qty 1

## 2017-02-22 MED ORDER — SODIUM CHLORIDE 0.9 % IV SOLN
INTRAVENOUS | Status: DC
  Administered 2017-02-22: 18:00:00 via INTRAVENOUS

## 2017-02-22 MED ORDER — AMIODARONE HCL IN DEXTROSE 360-4.14 MG/200ML-% IV SOLN
30.0000 mg/h | INTRAVENOUS | Status: DC
  Administered 2017-02-23: 30 mg/h via INTRAVENOUS
  Filled 2017-02-22: qty 200

## 2017-02-22 MED ORDER — ONDANSETRON 4 MG PO TBDP
4.0000 mg | ORAL_TABLET | Freq: Once | ORAL | Status: AC | PRN
  Administered 2017-02-22: 4 mg via ORAL
  Filled 2017-02-22: qty 1

## 2017-02-22 MED ORDER — HEPARIN SODIUM (PORCINE) 5000 UNIT/ML IJ SOLN
4000.0000 [IU] | Freq: Once | INTRAMUSCULAR | Status: AC
  Administered 2017-02-22: 4000 [IU] via INTRAVENOUS
  Filled 2017-02-22: qty 0.8

## 2017-02-22 MED ORDER — HEPARIN (PORCINE) IN NACL 2-0.9 UNIT/ML-% IJ SOLN
INTRAMUSCULAR | Status: AC
  Filled 2017-02-22: qty 1000

## 2017-02-22 MED ORDER — DIAZEPAM 5 MG PO TABS
5.0000 mg | ORAL_TABLET | Freq: Four times a day (QID) | ORAL | Status: DC | PRN
  Administered 2017-02-22 – 2017-02-24 (×4): 5 mg via ORAL
  Filled 2017-02-22 (×4): qty 1

## 2017-02-22 MED ORDER — METOPROLOL TARTRATE 5 MG/5ML IV SOLN
INTRAVENOUS | Status: DC | PRN
  Administered 2017-02-22: 2.5 mg via INTRAVENOUS

## 2017-02-22 MED ORDER — NITROGLYCERIN 1 MG/10 ML FOR IR/CATH LAB
INTRA_ARTERIAL | Status: DC | PRN
  Administered 2017-02-22 (×3): 200 ug via INTRACORONARY

## 2017-02-22 MED ORDER — SODIUM CHLORIDE 0.9 % IV SOLN: 250.0000 mL | INTRAVENOUS | Status: DC | PRN

## 2017-02-22 MED ORDER — AMIODARONE IV BOLUS ONLY 150 MG/100ML
150.0000 mg | Freq: Once | INTRAVENOUS | Status: AC
  Administered 2017-02-22: 150 mg via INTRAVENOUS
  Filled 2017-02-22: qty 100

## 2017-02-22 MED ORDER — AMIODARONE HCL IN DEXTROSE 360-4.14 MG/200ML-% IV SOLN
60.0000 mg/h | Freq: Once | INTRAVENOUS | Status: AC
  Administered 2017-02-22: 60 mg/h via INTRAVENOUS

## 2017-02-22 MED ORDER — CARVEDILOL 3.125 MG PO TABS: 3.1250 mg | ORAL_TABLET | Freq: Two times a day (BID) | ORAL | Status: DC

## 2017-02-22 MED ORDER — VERAPAMIL HCL 2.5 MG/ML IV SOLN
INTRAVENOUS | Status: AC
  Filled 2017-02-22: qty 2

## 2017-02-22 MED ORDER — FENTANYL CITRATE (PF) 100 MCG/2ML IJ SOLN
INTRAMUSCULAR | Status: DC | PRN
  Administered 2017-02-22 (×2): 25 ug via INTRAVENOUS

## 2017-02-22 MED ORDER — CLOPIDOGREL BISULFATE 300 MG PO TABS
ORAL_TABLET | ORAL | Status: DC | PRN
  Administered 2017-02-22: 600 mg via ORAL

## 2017-02-22 MED ORDER — FENTANYL CITRATE (PF) 100 MCG/2ML IJ SOLN
INTRAMUSCULAR | Status: AC
  Filled 2017-02-22: qty 2

## 2017-02-22 MED ORDER — LIDOCAINE HCL (PF) 1 % IJ SOLN
INTRAMUSCULAR | Status: DC | PRN
  Administered 2017-02-22: 15 mL via SUBCUTANEOUS

## 2017-02-22 MED ORDER — IOPAMIDOL (ISOVUE-370) INJECTION 76%
INTRAVENOUS | Status: AC
  Filled 2017-02-22: qty 100

## 2017-02-22 MED ORDER — IOPAMIDOL (ISOVUE-370) INJECTION 76%
INTRAVENOUS | Status: DC | PRN
  Administered 2017-02-22: 205 mL via INTRA_ARTERIAL

## 2017-02-22 MED ORDER — MIDAZOLAM HCL 2 MG/2ML IJ SOLN
INTRAMUSCULAR | Status: DC | PRN
  Administered 2017-02-22: 2 mg via INTRAVENOUS
  Administered 2017-02-22: 1 mg via INTRAVENOUS

## 2017-02-22 MED ORDER — IOPAMIDOL (ISOVUE-370) INJECTION 76%
INTRAVENOUS | Status: AC
  Filled 2017-02-22: qty 125

## 2017-02-22 MED ORDER — METOPROLOL TARTRATE 5 MG/5ML IV SOLN
INTRAVENOUS | Status: AC
  Filled 2017-02-22: qty 5

## 2017-02-22 MED ORDER — LIDOCAINE HCL (PF) 1 % IJ SOLN
INTRAMUSCULAR | Status: AC
  Filled 2017-02-22: qty 30

## 2017-02-22 MED ORDER — CLOPIDOGREL BISULFATE 75 MG PO TABS
75.0000 mg | ORAL_TABLET | Freq: Every day | ORAL | Status: DC
  Administered 2017-02-23 – 2017-02-27 (×5): 75 mg via ORAL
  Filled 2017-02-22 (×5): qty 1

## 2017-02-22 MED ORDER — NITROGLYCERIN 1 MG/10 ML FOR IR/CATH LAB
INTRA_ARTERIAL | Status: AC
  Filled 2017-02-22: qty 10

## 2017-02-22 MED ORDER — ACETAMINOPHEN 325 MG PO TABS
650.0000 mg | ORAL_TABLET | ORAL | Status: DC | PRN
  Administered 2017-02-24 – 2017-02-25 (×2): 650 mg via ORAL
  Filled 2017-02-22 (×2): qty 2

## 2017-02-22 MED ORDER — SODIUM CHLORIDE 0.9 % IV SOLN
INTRAVENOUS | Status: AC | PRN
  Administered 2017-02-22: 1.75 mg/kg/h via INTRAVENOUS
  Administered 2017-02-22: 20:00:00

## 2017-02-22 MED ORDER — HYDRALAZINE HCL 20 MG/ML IJ SOLN: 5.0000 mg | INTRAMUSCULAR | Status: AC | PRN

## 2017-02-22 MED ORDER — ISOSORBIDE MONONITRATE ER 30 MG PO TB24
15.0000 mg | ORAL_TABLET | Freq: Every day | ORAL | Status: DC
  Administered 2017-02-22: 15 mg via ORAL
  Filled 2017-02-22: qty 1

## 2017-02-22 MED ORDER — CLOPIDOGREL BISULFATE 300 MG PO TABS
ORAL_TABLET | ORAL | Status: AC
Start: 2017-02-22 — End: ?
  Filled 2017-02-22: qty 2

## 2017-02-22 MED ORDER — LABETALOL HCL 5 MG/ML IV SOLN: 10.0000 mg | INTRAVENOUS | Status: AC | PRN

## 2017-02-22 MED ORDER — AMIODARONE HCL IN DEXTROSE 360-4.14 MG/200ML-% IV SOLN
60.0000 mg/h | INTRAVENOUS | Status: AC
  Administered 2017-02-22: 60 mg/h via INTRAVENOUS

## 2017-02-22 MED ORDER — SODIUM CHLORIDE 0.9% FLUSH: 3.0000 mL | INTRAVENOUS | Status: DC | PRN

## 2017-02-22 MED FILL — Medication: Qty: 1 | Status: AC

## 2017-02-22 SURGICAL SUPPLY — 17 items
BALLN SAPPHIRE 2.5X20 (BALLOONS) ×2
BALLN SAPPHIRE ~~LOC~~ 3.25X15 (BALLOONS) ×2 IMPLANT
BALLOON SAPPHIRE 2.5X20 (BALLOONS) ×1 IMPLANT
CATH INFINITI 5FR MULTPACK ANG (CATHETERS) ×2 IMPLANT
CATH VISTA GUIDE 6FR XB3.5 (CATHETERS) ×2 IMPLANT
KIT ENCORE 26 ADVANTAGE (KITS) ×2 IMPLANT
KIT HEART LEFT (KITS) ×2 IMPLANT
PACK CARDIAC CATHETERIZATION (CUSTOM PROCEDURE TRAY) ×2 IMPLANT
SHEATH PINNACLE 5F 10CM (SHEATH) ×2 IMPLANT
SHEATH PINNACLE 6F 10CM (SHEATH) ×2 IMPLANT
STENT SIERRA 3.00 X 18 MM (Permanent Stent) ×2 IMPLANT
SYR MEDRAD MARK V 150ML (SYRINGE) ×2 IMPLANT
TRANSDUCER W/STOPCOCK (MISCELLANEOUS) ×2 IMPLANT
TUBING CIL FLEX 10 FLL-RA (TUBING) ×2 IMPLANT
WIRE COUGAR XT STRL 190CM (WIRE) ×2 IMPLANT
WIRE EMERALD 3MM-J .035X150CM (WIRE) ×2 IMPLANT
WIRE EMERALD ST .035X150CM (WIRE) ×2 IMPLANT
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
## 2017-02-22 NOTE — ED Notes (Signed)
Pt leaving via [facility]

## 2017-02-22 NOTE — Progress Notes (Signed)
Dr. Tresa EndoKelly Cards MD at bedside with patient.  I discussed with him regarding patient's BP diastolic >100.  No new orders given at this time.  I also discussed with him as to whether to continue the amiodarone gtt.  New orders given to continue amiodarone for normal allotted time (60 mg for 6 hrs, and then 30 mg for the continuation of gtt, likely until the morning when patient's condition is readdressed.    Patient told me that she was feeling anxious and depressed and was "going to seek mental health."  I will pass on in report for a psych consult to be ordered.  I called cards fellow to obtain a note for patient's Par[employer] Officer to state patient's condition, so that she will be relieved of her legal obligations for the duration of her hospital stay.

## 2017-02-22 NOTE — Progress Notes (Signed)
[facility] through ED. No family was present. Per EMS, the doctor spoke to the patient's mother, who with her husband will be coming to [place], possibly from the [place] area. Chaplain advised front Diplomatic Services operational officersecretary to page when family arrives.      02/22/17 1840  Clinical Encounter Type  Visited With Patient not available  Visit Type Initial  Referral From Physician  Consult/Referral To Chaplain  Stress Factors  Patient Stress Factors Health changes  Family Stress Factors Lack of knowledge

## 2017-02-22 NOTE — ED Notes (Signed)
[facility] aware [facility]

## 2017-02-22 NOTE — ED Triage Notes (Signed)
Per [facility] patient from home for chest pain every time she breaths and palpation that started 1-2 hours ago. Patient being vomiting today with chills. Patient believes she was poisoned after drinking one bottle of water.

## 2017-02-22 NOTE — ED Provider Notes (Addendum)
[facility] Provider Note   CSN: [id number] Arrival date & time: 02/22/17  1656  LEVEL 5 CAVEAT - ACUITY OF CONDITION/STEMI   History   Chief Complaint Chief Complaint  Patient presents with  . Emesis  . chest wall pain  . thinks she was poisoned    HPI Margaret Carpenter is a 31 y.o. female.  HPI  31 year old female presents with chest pain.  She states that around 11 AM she started having vomiting.  She has been vomiting ever since and over the last 2 or 3 hours developed chest pain.  She describes it as sharp, constant, and worsens with inspiration.  She feels short of breath as well.  She has a history of asthma and hypertension.  When asked if she is on medicine for hypertension she says no.  She denies cholesterol problems or diabetes.  She does smoke.  Unknown if she has a family history of coronary disease.  She denies any recent travel, surgery, leg swelling, or any history of DVT/PE.  Her pain is currently 10/10 severe.  Past Medical History:  Diagnosis Date  . Asthma   . Hypertension     There are no active problems to display for this patient.   Past Surgical History:  Procedure Laterality Date  . CESAREAN SECTION      OB History    No data available       Home Medications    Prior to Admission medications   Medication Sig Start Date End Date Taking? Authorizing Provider  acetaminophen (TYLENOL) 500 MG tablet Take 500 mg by mouth every 6 (six) hours as needed for moderate pain.    [provider]  naproxen (NAPROSYN) 500 MG tablet Take 1 tablet (500 mg total) by mouth 2 (two) times daily. 07/18/15   Horton, Mayer Maskerourtney F, MD    Family History No family history on file.  Social History Social History   Tobacco Use  . Smoking status: Current Every Day Smoker    Packs/day: 0.50    Types: Cigarettes  . Smokeless tobacco: Never Used  Substance Use Topics  . Alcohol use: Yes    Comment: socially  . Drug use:  No     Allergies   Hydrocodone; Morphine and related; Penicillins; and Percocet [oxycodone-acetaminophen]   Review of Systems Review of Systems  Unable to perform ROS: Acuity of condition     Physical Exam Updated Vital Signs BP 102/63 (BP Location: Right Arm)   Pulse 92   Temp 98.6 F (37 C) (Oral)   Resp (!) 21   Ht 5' 3.5" (1.613 m)   Wt 65.8 kg (145 lb)   LMP 02/07/2017   SpO2 100%   BMI 25.28 kg/m   Physical Exam  Constitutional: She is oriented to person, place, and time. She appears well-developed and well-nourished. She appears distressed.  HENT:  Head: Normocephalic and atraumatic.  Right Ear: External ear normal.  Left Ear: External ear normal.  Nose: Nose normal.  Eyes: Right eye exhibits no discharge. Left eye exhibits no discharge.  Cardiovascular: Normal rate, regular rhythm and normal heart sounds.  Pulmonary/Chest: Effort normal and breath sounds normal. She has no wheezes. She has no rales. She exhibits no tenderness.  Abdominal: Soft. There is no tenderness.  Musculoskeletal: She exhibits no edema.  Neurological: She is alert and oriented to person, place, and time.  Skin: Skin is warm and dry. She is not diaphoretic.  Nursing note and vitals  reviewed.    ED Treatments / Results  Labs (all labs ordered are listed, but only abnormal results are displayed) Labs Reviewed  COMPREHENSIVE METABOLIC PANEL - Abnormal; Notable for the following components:      Result Value   Potassium 3.4 (*)    Chloride 100 (*)    Glucose, Bld 118 (*)    Total Protein 8.9 (*)    All other components within normal limits  CBC WITH DIFFERENTIAL/PLATELET - Abnormal; Notable for the following components:   WBC 12.5 (*)    Neutro Abs 10.0 (*)    All other components within normal limits  I-STAT CHEM 8, ED - Abnormal; Notable for the following components:   Glucose, Bld 116 (*)    Calcium, Ion 1.14 (*)    Hemoglobin 15.3 (*)    All other components within normal  limits  LIPASE, BLOOD  PROTIME-INR  APTT  URINALYSIS, ROUTINE W REFLEX MICROSCOPIC  TROPONIN I  LIPID PANEL  I-STAT TROPONIN, ED  I-STAT BETA HCG BLOOD, ED ([facility], [facility], [facility] ONLY)  I-STAT CHEM 8, ED    EKG  EKG Interpretation  Date/Time:  Wednesday February 22 2017 17:11:46 EST Ventricular Rate:  82 PR Interval:    QRS Duration: 107 QT Interval:  379 QTC Calculation: 443 R Axis:   -3 Text Interpretation:  Sinus rhythm RSR' in V1 or V2, right VCD or RVH Inferior infarct, acute (RCA) Probable RV involvement, suggest recording right precordial leads Baseline wander in lead(s) II III aVF V3 V5 V6 ** ** ACUTE MI / STEMI ** ** Confirmed by Pricilla Loveless [phone]) on 02/22/2017 6:22:31 PM       EKG Interpretation  Date/Time:  Wednesday February 22 2017 17:44:22 EST Ventricular Rate:  90 PR Interval:    QRS Duration: 129 QT Interval:  424 QTC Calculation: 519 R Axis:   93 Text Interpretation:  Sinus rhythm Right bundle branch block Inferior infarct, acute (RCA) Probable RV involvement, suggest recording right precordial leads ** ** ACUTE MI / STEMI ** ** ST elevations worse compared to earlier in the day Confirmed by Pricilla Loveless [phone]) on 02/22/2017 6:23:16 PM       Radiology No results found.  Procedures .Critical Care Performed by: Pricilla Loveless, MD Authorized by: Pricilla Loveless, MD   Critical care provider statement:    Critical care time (minutes):  30   Critical care was necessary to treat or prevent imminent or life-threatening deterioration of the following conditions:  Circulatory failure and cardiac failure   Critical care was time spent personally by me on the following activities:  Discussions with consultants, development of treatment plan with patient or surrogate, examination of patient, obtaining history from patient or surrogate, ordering and performing treatments and interventions, ordering and review of laboratory studies, ordering and review of  radiographic studies, pulse oximetry, re-evaluation of patient's condition and review of old charts   (including critical care time)  Cardiopulmonary Resuscitation (CPR) Procedure Note Directed/Performed by: Audree Camel I personally directed ancillary staff and/or performed CPR in an effort to regain return of spontaneous circulation and to maintain cardiac, neuro and systemic perfusion.    Medications Ordered in ED Medications  0.9 %  sodium chloride infusion ( Intravenous New Bag/Given 02/22/17 1737)  amiodarone (NEXTERONE PREMIX) 1.8 mg/mL infusion ( Intravenous MAR Hold 02/22/17 1817)  ondansetron (ZOFRAN-ODT) disintegrating tablet 4 mg (4 mg Oral Given 02/22/17 1712)  aspirin chewable tablet 324 mg (324 mg Oral Given 02/22/17 1752)  heparin injection 4,000  Units (4,000 Units Intravenous Given 02/22/17 1751)  amiodarone (NEXTERONE) IV bolus only 150 mg/100 mL (0 mg Intravenous Stopped 02/22/17 1747)     Initial Impression / Assessment and Plan / ED Course  I have reviewed the triage vital signs and the nursing notes.  Pertinent labs & imaging results that were available during my care of the patient were reviewed by me and considered in my medical decision making (see chart for details).  Clinical Course as of Feb 22 1830  Wed Feb 22, 2017  1743 D/w Dr. Eston Mouldulley, cardiology, who accepts patient as transfer emergently to cath lab.  [SG]    Clinical Course User Index [SG] Pricilla LovelessGoldston, Syd Newsome, MD    After being handed ECG, this was reviewed and a STEMI was called.  She was brought back to the resuscitation room.  There she had stable vital signs but was in obvious pain.  There is no reproducible chest pain.  She shows no signs of DVT.  While waiting for the call back from cardiology for the STEMI, she went into a V. fib arrest.  CPR was started and she was shocked 3 times.  She was given amiodarone bolus and started on a drip.  After the third shock, she regained consciousness and is  now awake, alert, and back to herself.  Thus, acute airway management deferred as she is now more stable and has an intact airway.  Her vital signs are stable now she has no further PVCs.  She was given aspirin and a heparin bolus.  [facility] emerg[facility] emergent catheter[facility].  I did discuss with [facility] EDP, Dr. Madilyn Hookees, in case patient stops in [facility].  At the patient's request, I called her mom, Margaret Carpenter, at [phone] and let her know what was going on with patient, and she and her husband are driving to [facility] from [place]  Final Clinical Impressions(s) / ED Diagnoses   Final diagnoses:  ST elevation myocardial infarction (STEMI), unspecified artery [facility](HCC)  Ventricular fibrillation [facility](HCC)  Cardiac arrest [facility](HCC)    ED Discharge Orders    None       Pricilla LovelessGoldston, Somaya Grassi, MD 02/22/17 [id number]    Pricilla LovelessGoldston, Rafferty Postlewait, MD 02/22/17 Paulo [id number]

## 2017-02-22 NOTE — ED Notes (Signed)
At 1732 pt became unresponsive and went into v fib. EDP at bedside noted pt was pulseless and CPR was started. 1733 pt was shocked, V fib noted on monitor, CPR continued 1734 pt shocked again, V fib continued, CPR continued 1735 pt still in v fib, shocked again, CPR contined 1736 pt became resposive 2nd IV started (18ga R wrist) 1737 150mg  amiodarone given bolus per EDP verbal order. [facility] at bedside. EDP calling cath lab.

## 2017-02-22 NOTE — ED Provider Notes (Signed)
Patient received in transfer [facility] department.  She presented to the department with chest pain and had a V. fib arrest in [facility].       Tilden Fossaees, Jennica Tagliaferri, MD 02/22/17 2138

## 2017-02-22 NOTE — Progress Notes (Signed)
    Patient ID: Margaret MachoCaprica Mccaffrey MRN: [id number], DOB/AGE: [birth year]   To Whom it May Concern:  Ms. Margaret Carpenter is currently hospitalized at [facility]. She was admitted 02/22/2017 to the cardiology service. I anticipate she will be hospitalized for a minimum of 4 days.  Dimple CaseyPAULEY, ERIC D, MD NPI [id number] 02/22/2017, 9:59 PM

## 2017-02-22 NOTE — H&P (Addendum)
Cardiology Admission History and Physical:   Patient ID: Margaret Carpenter; MRN: [id number]; DOB: 12/31/1985   Admission date: 02/22/2017  Primary Care Provider: Patient, No Pcp Per Primary Cardiologist: No primary care provider on file.  Primary Electrophysiologist:      Patient Profile:   Margaret Carpenter is a 31 y.o. female with a history of who was [facility] after suffering a VF cardiac arrest in the setting of inferior STEMI.  History of Present Illness:   Margaret Carpenter is a 31 year old female who has a long-standing history of tobacco abuse since age 31.  She experience nausea and vomiting today for several hours during the afternoon and ultimately presented [facility] room.  Her ECG showed early ST elevation inferolaterally, and she developed a ventricular fibrillation cardiac arrest requiring defibrillation and CPR.  She was treated with IV n amiodarone and heparin.  Code STEMI was activated.  Upon [facility] hospital.  She had an episode of VT in the ambulance.  She is brought acutely to the cardiac catheterization laboratory for emergent cardiac catheterization with a high index of spontaneous coronary artery dissection as a possible etiology.   Past Medical History:  Diagnosis Date  . Asthma   . Hypertension     Past Surgical History:  Procedure Laterality Date  . CESAREAN SECTION       Medications Prior to Admission: Prior to Admission medications   Medication Sig Start Date End Date Taking? Authorizing Provider  acetaminophen (TYLENOL) 500 MG tablet Take 500 mg by mouth every 6 (six) hours as needed for moderate pain.    [provider]  naproxen (NAPROSYN) 500 MG tablet Take 1 tablet (500 mg total) by mouth 2 (two) times daily. 07/18/15   Horton, Mayer Maskerourtney F, MD     Allergies:    Allergies  Allergen Reactions  . Hydrocodone Itching  . Morphine And Related Itching  . Penicillins Hives  . Percocet  [Oxycodone-Acetaminophen] Itching    Social History:   Social History   Socioeconomic History  . Marital status: Single    Spouse name: Not on file  . Number of children: Not on file  . Years of education: Not on file  . Highest education level: Not on file  Social Needs  . Financial resource strain: Not on file  . Food insecurity - worry: Not on file  . Food insecurity - inability: Not on file  . Transportation needs - medical: Not on file  . Transportation needs - non-medical: Not on file  Occupational History  . Not on file  Tobacco Use  . Smoking status: Current Every Day Smoker    Packs/day: 0.50    Types: Cigarettes  . Smokeless tobacco: Never Used  Substance and Sexual Activity  . Alcohol use: Yes    Comment: socially  . Drug use: No  . Sexual activity: No  Other Topics Concern  . Not on file  Social History Narrative  . Not on file     She lives with a girlfriend.  Current legal issues.   Family History:  The patient's family history is not on file.   Father unknown; Mother alive.  5 siblings  ROS:  Please see the history of present illness.   Positive for allergies.  All other ROS reviewed and negative.     Physical Exam/Data:   Vitals:   02/22/17 1937 02/22/17 1942 02/22/17 1947 02/22/17 1952  BP: 130/84 (!) 123/92 (!) 131/95  Pulse: 82 81 80 84  Resp: 14 12 (!) 23 (!) 0  Temp:      TempSrc:      SpO2: 100% 100% 100% (!) 0%  Weight:      Height:        Intake/Output Summary (Last 24 hours) at 02/22/2017 2035 Last data filed at 02/22/2017 1747 Gross per 24 hour  Intake 100 ml  Output -  Net 100 ml   Filed Weights   02/22/17 1708  Weight: 145 lb (65.8 kg)   Body mass index is 25.28 kg/m.  General:  Well nourished, well developed, HEENT: normal .  Poor dentition Lymph: no adenopathy Neck: no JVD Endocrine:  No thryomegaly Vascular: No carotid bruits; FA pulses 2+ bilaterally without bruits  Cardiac:  normal S1, S2; RRR; 1/6 SEM no  s3. Lungs:  clear to auscultation bilaterally, no wheezing, rhonchi or rales  Abd: soft, nontender, no hepatomegaly  Ext: no edema Musculoskeletal:  No deformities, BUE and BLE strength normal and equal Skin: warm and dry  Neuro:  CNs 2-12 intact, no focal abnormalities noted Psych:  Normal affect    EKG:  The ECG that was done  was personally reviewed and demonstrates sinus rhythm at 90 bpm with right bundle branch block and inferior ST elevation consistent with inferior STEMI.    Laboratory Data:  Chemistry Recent Labs  Lab 02/22/17 1722 02/22/17 1744  NA 138 141  K 3.4* 3.5  CL 100* 104  CO2 23  --   GLUCOSE 118* 116*  BUN 10 8  CREATININE 0.91 0.80  CALCIUM 10.1  --   GFRNONAA >60  --   GFRAA >60  --   ANIONGAP 15  --     Recent Labs  Lab 02/22/17 1722  PROT 8.9*  ALBUMIN 5.0  AST 39  ALT 19  ALKPHOS 64  BILITOT 1.1   Hematology Recent Labs  Lab 02/22/17 1722 02/22/17 1744  WBC 12.5*  --   RBC 4.52  --   HGB 14.0 15.3*  HCT 40.4 45.0  MCV 89.4  --   MCH 31.0  --   MCHC 34.7  --   RDW 13.7  --   PLT 397  --    Cardiac Enzymes Recent Labs  Lab 02/22/17 1722  TROPONINI 0.06*    Recent Labs  Lab 02/22/17 1735  TROPIPOC 0.06    BNPNo results for input(s): BNP, PROBNP in the last 168 hours.  DDimer No results for input(s): DDIMER in the last 168 hours.  Radiology/Studies:  No results found.  Assessment and Plan:   1. Inferior ST segment elevation myocardial infarction, complicated by VF cardiac arrest requiring defibrillation/CPR.  Plan emergent cardiac catheterization.  There is a high index of suspicion for SCAD in this 31 year old female.  However, with her 18 year history of tobacco use, obstructive etiology is also possible.  Severity of Illness: The appropriate patient status for this patient is INPATIENT. Inpatient status is judged to be reasonable and necessary in order to provide the required intensity of service to ensure the  patient's safety. The patient's presenting symptoms, physical exam findings, and initial radiographic and laboratory data in the context of their chronic comorbidities is felt to place them at high risk for further clinical deterioration. Furthermore, it is not anticipated that the patient will be medically stable for discharge from the hospital within 2 midnights of admission. The following factors support the patient status of inpatient.   " The patient's  presenting symptoms include STEMI/cardiac arrest.   * I certify that at the point of admission it is my clinical judgment that the patient will require inpatient hospital care spanning beyond 2 midnights from the point of admission due to high intensity of service, high risk for further deterioration and high frequency of surveillance required.*    For questions or updates, please contact [facility] Please consult [email or web address] for contact info under Cardiology/STEMI.    Signed, Nicki Guadalajara, MD  02/22/2017 8:35 PM

## 2017-02-23 ENCOUNTER — Encounter (HOSPITAL_COMMUNITY): Payer: Self-pay

## 2017-02-23 ENCOUNTER — Other Ambulatory Visit: Payer: Self-pay

## 2017-02-23 ENCOUNTER — Inpatient Hospital Stay (HOSPITAL_COMMUNITY): Payer: Self-pay

## 2017-02-23 DIAGNOSIS — I213 ST elevation (STEMI) myocardial infarction of unspecified site: Secondary | ICD-10-CM

## 2017-02-23 LAB — BASIC METABOLIC PANEL
ANION GAP: 7 (ref 5–15)
BUN: 7 mg/dL (ref 6–20)
CALCIUM: 8.6 mg/dL — AB (ref 8.9–10.3)
CHLORIDE: 105 mmol/L (ref 101–111)
CO2: 25 mmol/L (ref 22–32)
Creatinine, Ser: 0.79 mg/dL (ref 0.44–1.00)
GFR calc non Af Amer: >60 mL/min (ref >60)
Glucose, Bld: 118 mg/dL — ABNORMAL HIGH (ref 65–99)
POTASSIUM: 3.4 mmol/L — AB (ref 3.5–5.1)
Sodium: 137 mmol/L (ref 135–145)

## 2017-02-23 LAB — ECHOCARDIOGRAM COMPLETE
Height: 64 in
WEIGHTICAEL: 2236.35 [oz_av]

## 2017-02-23 LAB — CBC
HCT: 33.1 % — ABNORMAL LOW (ref 36.0–46.0)
HEMOGLOBIN: 11 g/dL — AB (ref 12.0–15.0)
MCH: 29.6 pg (ref 26.0–34.0)
MCHC: 33.2 g/dL (ref 30.0–36.0)
MCV: 89.2 fL (ref 78.0–100.0)
Platelets: 296 10*3/uL (ref 150–400)
RBC: 3.71 MIL/uL — AB (ref 3.87–5.11)
RDW: 13.7 % (ref 11.5–15.5)
WBC: 10.8 10*3/uL — ABNORMAL HIGH (ref 4.0–10.5)

## 2017-02-23 MED ORDER — CARVEDILOL 6.25 MG PO TABS
6.2500 mg | ORAL_TABLET | Freq: Two times a day (BID) | ORAL | Status: DC
  Administered 2017-02-23 – 2017-02-27 (×7): 6.25 mg via ORAL
  Filled 2017-02-23 (×8): qty 1

## 2017-02-23 MED ORDER — POTASSIUM CHLORIDE CRYS ER 20 MEQ PO TBCR
40.0000 meq | EXTENDED_RELEASE_TABLET | Freq: Once | ORAL | Status: AC
  Administered 2017-02-23: 40 meq via ORAL
  Filled 2017-02-23: qty 2

## 2017-02-23 MED ORDER — ISOSORBIDE MONONITRATE ER 30 MG PO TB24
30.0000 mg | ORAL_TABLET | Freq: Every day | ORAL | Status: DC
  Administered 2017-02-23 – 2017-02-27 (×5): 30 mg via ORAL
  Filled 2017-02-23 (×5): qty 1

## 2017-02-23 MED ORDER — ATORVASTATIN CALCIUM 40 MG PO TABS
40.0000 mg | ORAL_TABLET | Freq: Every day | ORAL | Status: DC
  Administered 2017-02-23 – 2017-02-26 (×4): 40 mg via ORAL
  Filled 2017-02-23 (×4): qty 1

## 2017-02-23 NOTE — Progress Notes (Signed)
  Echocardiogram 2D Echocardiogram has been performed.  Margaret Carpenter, Margaret Carpenter 02/23/2017, 12:15 PM

## 2017-02-23 NOTE — Plan of Care (Signed)
  Progressing Education: Knowledge of General Education information will improve 02/23/2017 2212 - Progressing by Olena Materobinson, Barkley Kratochvil G, RN Note POC reviewed with pt. Coping: Level of anxiety will decrease 02/23/2017 2212 - Progressing by Olena Materobinson, Aureliano Oshields G, RN Note Pt, dealing with anxiety and has outside factors impacting her anxiety; and also the fact of her having a cardiac arrest. Prior shift initiated a consult with SW. Valium given prn.

## 2017-02-23 NOTE — Progress Notes (Signed)
Progress Note  Patient Name: Margaret Carpenter Date of Encounter: 02/23/2017  Primary Cardiologist: new Tresa EndoKelly  Subjective   No recurrent chest pain.  Inpatient Medications    Scheduled Meds: . aspirin  81 mg Oral Daily  . carvedilol  3.125 mg Oral BID WC  . clopidogrel  75 mg Oral Q breakfast  . isosorbide mononitrate  15 mg Oral Daily  . sodium chloride flush  3 mL Intravenous Q12H   Continuous Infusions: . sodium chloride 20 mL/hr at 02/22/17 1737  . sodium chloride 150 mL/hr at 02/23/17 0122  . sodium chloride    . amiodarone 30 mg/hr (02/23/17 0523)   PRN Meds: sodium chloride, acetaminophen, diazepam, ondansetron (ZOFRAN) IV, sodium chloride flush   Vital Signs    Vitals:   02/23/17 0400 02/23/17 0500 02/23/17 0600 02/23/17 0700  BP: 100/68 104/70 98/65 98/64   Pulse: 77 86 87 84  Resp: 17 17 18 19   Temp: 98.5 F (36.9 C)     TempSrc: Oral     SpO2: 98% 97% 98% 99%  Weight:      Height:        Intake/Output Summary (Last 24 hours) at 02/23/2017 0755 Last data filed at 02/23/2017 0600 Gross per 24 hour  Intake 2289.79 ml  Output -  Net 2289.79 ml    I/O since admission:  +2289  [facility]   02/22/17 1708 02/22/17 2045  Weight: 145 lb (65.8 kg) 139 lb 12.4 oz (63.4 kg)    Telemetry    Sinus rhythm  79 - Personally Reviewed  ECG    ECG (independently read by me): NSR at 83; IRBBB; T wave inversion 2,3,aVF; QTc 533  Physical Exam   BP 98/64   Pulse 84   Temp 98.5 F (36.9 C) (Oral)   Resp 19   Ht 5\' 4"  (1.626 m)   Wt 139 lb 12.4 oz (63.4 kg)   LMP 02/07/2017   SpO2 99%   BMI 23.99 kg/m  General: Alert, oriented, no distress.  Skin: normal turgor, no rashes, warm and dry HEENT: Normocephalic, atraumatic. Pupils equal round and reactive to light; sclera anicteric; extraocular muscles intact;  Nose without nasal septal hypertrophy Mouth/Parynx benign; Mallinpatti scale 3 Neck: No JVD, no carotid bruits; normal carotid  upstroke Lungs: clear to ausculatation and percussion; no wheezing or rales Chest wall: without tenderness to palpitation Heart: PMI not displaced, RRR, s1 s2 normal, 1/6 systolic murmur, no diastolic murmur, no rubs, gallops, thrills, or heaves Abdomen: soft, nontender; no hepatosplenomehaly, BS+; abdominal aorta nontender and not dilated by palpation. Back: no CVA tenderness Pulses 2+ Musculoskeletal: full range of motion, normal strength, no joint deformities Extremities: no clubbing cyanosis or edema, Homan's sign negative  Neurologic: grossly nonfocal; Cranial nerves grossly wnl Psychologic: Normal mood and affect   Labs    Chemistry Recent Labs  Lab 02/22/17 1722 02/22/17 1744 02/23/17 0300  NA 138 141 137  K 3.4* 3.5 3.4*  CL 100* 104 105  CO2 23  --  25  GLUCOSE 118* 116* 118*  BUN 10 8 7   CREATININE 0.91 0.80 0.79  CALCIUM 10.1  --  8.6*  PROT 8.9*  --   --   ALBUMIN 5.0  --   --   AST 39  --   --   ALT 19  --   --   ALKPHOS 64  --   --   BILITOT 1.1  --   --   GFRNONAA >60  --  >  60  GFRAA >60  --  >60  ANIONGAP 15  --  7     Hematology Recent Labs  Lab 02/22/17 1722 02/22/17 1744 02/23/17 0300  WBC 12.5*  --  10.8*  RBC 4.52  --  3.71*  HGB 14.0 15.3* 11.0*  HCT 40.4 45.0 33.1*  MCV 89.4  --  89.2  MCH 31.0  --  29.6  MCHC 34.7  --  33.2  RDW 13.7  --  13.7  PLT 397  --  296    Cardiac Enzymes Recent Labs  Lab 02/22/17 1722 02/22/17 2106  TROPONINI 0.06* 0.54*    Recent Labs  Lab 02/22/17 1735  TROPIPOC 0.06     BNPNo results for input(s): BNP, PROBNP in the last 168 hours.   DDimer No results for input(s): DDIMER in the last 168 hours.   Lipid Panel     Component Value Date/Time   CHOL 192 02/22/2017 1721   TRIG 115 02/22/2017 1721   HDL 82 02/22/2017 1721   CHOLHDL 2.3 02/22/2017 1721   VLDL 23 02/22/2017 1721   LDLCALC 87 02/22/2017 1721    Radiology    Dg Chest Portable 1 View  Result Date: 02/22/2017 CLINICAL  DATA:  Acute ST elevated myocardial infarct. Status post coronary stent placement. EXAM: PORTABLE CHEST 1 VIEW COMPARISON:  07/18/2015 FINDINGS: The heart size and mediastinal contours are within normal limits. Both lungs are clear. The visualized skeletal structures are unremarkable. IMPRESSION: No active disease. Electronically Signed   By: Myles RosenthalJohn  Stahl M.D.   On: 02/22/2017 21:18    Cardiac Studies   Will order echo  Patient Profile     Margaret Carpenter is a 31 y.o. female with a history of who was [facility] after suffering a VF cardiac arrest in the setting of inferior STEMI.  Assessment & Plan    1. Inferolateral STEMI / VF cardiac arrest: Day 1 s/p PCI to LCX. No recurrent angina. No further arrhythmia.  Will dc amiodarone and titrate coreg as BP allows. Will increase to 6.25 mg bid today.  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 Obstructive vs SCAD in Ramus.  Medical therapy.  3  Hypo K: replete to K > 4.0 4. Tobacco abuse; 18 yrs;  Pt now cokmmitted to dc. 5  Lipids: now on atorvastatin  6.  Anemia: re-check   Plan to keep in hospital for at least 4 days.  Signed, Lennette Biharihomas A. Kelly, MD, [facility] 02/23/2017, 7:55 AM

## 2017-02-23 NOTE — Progress Notes (Signed)
Femoral sheath D/C'd @ 0029, manual pressure maintained for 30 minutes, BP taken every 3 minutes, VSS and pedal pulses palpable throughout. Site level 1 d/t oozing at beginning of holding manual pressure, now level 0. Pt educated on sheath pull and s/s of bleeding. Pt tolerated procedure well. Bed rest 6 hours begins now. Pt's RN aware.  Herma ArdMOSELEY, Irene Mitcham F, RN

## 2017-02-24 ENCOUNTER — Encounter (HOSPITAL_COMMUNITY): Payer: Self-pay | Admitting: Cardiovascular Disease

## 2017-02-24 LAB — GLUCOSE, CAPILLARY: Glucose-Capillary: 102 mg/dL — ABNORMAL HIGH (ref 65–99)

## 2017-02-24 LAB — BASIC METABOLIC PANEL
Anion gap: 7 (ref 5–15)
BUN: 7 mg/dL (ref 6–20)
CHLORIDE: 106 mmol/L (ref 101–111)
CO2: 24 mmol/L (ref 22–32)
CREATININE: 0.78 mg/dL (ref 0.44–1.00)
Calcium: 8.5 mg/dL — ABNORMAL LOW (ref 8.9–10.3)
GFR calc Af Amer: >60 mL/min (ref >60)
GFR calc non Af Amer: >60 mL/min (ref >60)
GLUCOSE: 105 mg/dL — AB (ref 65–99)
Potassium: 3.6 mmol/L (ref 3.5–5.1)
SODIUM: 137 mmol/L (ref 135–145)

## 2017-02-24 LAB — POCT ACTIVATED CLOTTING TIME: Activated Clotting Time: 224 seconds

## 2017-02-24 NOTE — Progress Notes (Addendum)
Progress Note  Patient Name: Margaret Carpenter Date of Encounter: 02/24/2017  Primary Cardiologist: new Tresa EndoKelly  Subjective   No chest pain  Inpatient Medications    Scheduled Meds: . aspirin  81 mg Oral Daily  . atorvastatin  40 mg Oral q1800  . carvedilol  6.25 mg Oral BID WC  . clopidogrel  75 mg Oral Q breakfast  . isosorbide mononitrate  30 mg Oral Daily  . sodium chloride flush  3 mL Intravenous Q12H   Continuous Infusions: . sodium chloride Stopped (02/23/17 1000)  . sodium chloride Stopped (02/23/17 1000)  . sodium chloride Stopped (02/23/17 1000)   PRN Meds: sodium chloride, acetaminophen, diazepam, ondansetron (ZOFRAN) IV, sodium chloride flush   Vital Signs    Vitals:   02/23/17 1800 02/23/17 1900 02/23/17 2352 02/24/17 0401  BP: 95/73 101/70 (!) 95/55   Pulse: 96 85    Resp: 18 20    Temp:   98.5 F (36.9 C) 98.5 F (36.9 C)  TempSrc:   Oral Oral  SpO2: 100% 98% 98%   Weight:      Height:        Intake/Output Summary (Last 24 hours) at 02/24/2017 0730 Last data filed at 02/23/2017 1900 Gross per 24 hour  Intake 903 ml  Output -  Net 903 ml    I/O since admission:  +3192  Filed Weights   02/22/17 1708 02/22/17 2045  Weight: 145 lb (65.8 kg) 139 lb 12.4 oz (63.4 kg)    Telemetry    Sinus rhythm  80 - Personally Reviewed  ECG    ECG (independently read by me): NSR at 81; IRBBB, T inversion 2, 3, aVF, QTc 532  02/22/17 ECG (independently read by me): NSR at 83; IRBBB; T wave inversion 2,3,aVF; QTc 533  Physical Exam   BP (!) 95/55   Pulse 85   Temp 98.5 F (36.9 C) (Oral)   Resp 20   Ht 5\' 4"  (1.626 m)   Wt 139 lb 12.4 oz (63.4 kg)   LMP 02/07/2017   SpO2 98%   BMI 23.99 kg/m    BP now 102/71; ASX  General: Alert, oriented, no distress.  Skin: normal turgor, no rashes, warm and dry HEENT: Normocephalic, atraumatic. Pupils equal round and reactive to light; sclera anicteric; extraocular muscles intact;  Nose without nasal  septal hypertrophy Mouth/Parynx benign; Mallinpatti scale 3 Neck: No JVD, no carotid bruits; normal carotid upstroke Lungs: clear to ausculatation and percussion; no wheezing or rales Chest wall: without tenderness to palpitation Heart: PMI not displaced, RRR, s1 s2 normal, 1/6 systolic murmur, no diastolic murmur, no rubs, gallops, thrills, or heaves Abdomen: soft, nontender; no hepatosplenomehaly, BS+; abdominal aorta nontender and not dilated by palpation. Back: no CVA tenderness Pulses 2+ Musculoskeletal: full range of motion, normal strength, no joint deformities Extremities: no clubbing cyanosis or edema, Homan's sign negative  Neurologic: grossly nonfocal; Cranial nerves grossly wnl Psychologic: Normal mood and affect   Labs    Chemistry Recent Labs  Lab 02/22/17 1722 02/22/17 1744 02/23/17 0300 02/24/17 0320  NA 138 141 137 137  K 3.4* 3.5 3.4* 3.6  CL 100* 104 105 106  CO2 23  --  25 24  GLUCOSE 118* 116* 118* 105*  BUN 10 8 7 7   CREATININE 0.91 0.80 0.79 0.78  CALCIUM 10.1  --  8.6* 8.5*  PROT 8.9*  --   --   --   ALBUMIN 5.0  --   --   --  AST 39  --   --   --   ALT 19  --   --   --   ALKPHOS 64  --   --   --   BILITOT 1.1  --   --   --   GFRNONAA >60  --  >60 >60  GFRAA >60  --  >60 >60  ANIONGAP 15  --  7 7     Hematology Recent Labs  Lab 02/22/17 1722 02/22/17 1744 02/23/17 0300  WBC 12.5*  --  10.8*  RBC 4.52  --  3.71*  HGB 14.0 15.3* 11.0*  HCT 40.4 45.0 33.1*  MCV 89.4  --  89.2  MCH 31.0  --  29.6  MCHC 34.7  --  33.2  RDW 13.7  --  13.7  PLT 397  --  296    Cardiac Enzymes Recent Labs  Lab 02/22/17 1722 02/22/17 2106  TROPONINI 0.06* 0.54*    Recent Labs  Lab 02/22/17 1735  TROPIPOC 0.06     BNPNo results for input(s): BNP, PROBNP in the last 168 hours.   DDimer No results for input(s): DDIMER in the last 168 hours.   Lipid Panel     Component Value Date/Time   CHOL 192 02/22/2017 1721   TRIG 115 02/22/2017 1721    HDL 82 02/22/2017 1721   CHOLHDL 2.3 02/22/2017 1721   VLDL 23 02/22/2017 1721   LDLCALC 87 02/22/2017 1721    Radiology    Dg Chest Portable 1 View  Result Date: 02/22/2017 CLINICAL DATA:  Acute ST elevated myocardial infarct. Status post coronary stent placement. EXAM: PORTABLE CHEST 1 VIEW COMPARISON:  07/18/2015 FINDINGS: The heart size and mediastinal contours are within normal limits. Both lungs are clear. The visualized skeletal structures are unremarkable. IMPRESSION: No active disease. Electronically Signed   By: Myles RosenthalJohn  Stahl M.D.   On: 02/22/2017 21:18    Cardiac Studies   ------------------------------------------------------------------- ECHO Study Conclusions  - Left ventricle: The cavity size was normal. Wall thickness was   normal. Systolic function was normal. The estimated ejection   fraction was in the range of 50% to 55%. There is hypokinesis of   the inferolateral myocardium. Left ventricular diastolic function   parameters were normal.  Impressions:  - Hypokinesis of the inferolateral wall with overall preserved LV   systolic function; trace MR and TR.   Patient Profile     Margaret Carpenter is a 31 y.o. female with a history of who [facility] after suffering a VF cardiac arrest in the setting of inferior STEMI.  Assessment & Plan    1. Inferolateral STEMI / VF cardiac arrest: Day 2 s/p PCI to LCX. No recurrent angina. No further arrhythmia.  Off amiodarone. Now on coreg 6.25 mg bid today.  Inferolateral hypokinesis on echo with EF better at 50 - 55% from initial cath. BP too low to start post MI  ACE-I presently.  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 Obstructive vs SCAD in Ramus.  Medical therapy.  3  Hypo K: increased to 3.6 today; will give 20 meq KDUR 4. Tobacco abuse; 18 yrs;  Pt now agrees to DC 5  Lipids: now on atorvastatin  6.  Anemia: re-check   Will transfer out of CCU today; with potential SCAD  component may keep in hospital for 4 days for close observation.  Signed, Lennette Biharihomas A. Brenleigh Collet, MD, [facility] 02/24/2017, 7:30 AM

## 2017-02-24 NOTE — Plan of Care (Signed)
VSS and WNL. Will continue to monitor patient closely 

## 2017-02-24 NOTE — Progress Notes (Signed)
Pt tearful throughout day concerned about not having family closer and being alone. Pt also stated not having place to live after D/C, not having a job, and currently doing jail time.  This RN attempted to call pt's probation officer for update, unable to get on phone, message was left to return call.  SW called.

## 2017-02-24 NOTE — Progress Notes (Signed)
Report called to [facility]. All questions answered. Pt verbalized understanding of tx.
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
## 2017-02-24 NOTE — Progress Notes (Signed)
CARDIAC REHAB PHASE I   PRE:  Rate/Rhythm: 81 SR    BP: sitting 96/66    SaO2: 98 RA  MODE:  Ambulation: 520 ft   POST:  Rate/Rhythm: 92 SR    BP: sitting 112/71     SaO2: 98 RA  Tolerated well, no c/o. Felt good to walk. VSS. Pt is anxious and overwhelmed. She has many stressors. Living situation is poor, she is serving jail time, she is a recovering alcoholic, she has no insurance. She is eager to work on her health and have positive influences. She requests speaking with a chaplain whom I called. I also called the CM to investigate getting her meds. Ed completed including importance of meds (plavix/ASA) and quitting smoking. Pt is motivated. She is not very interested in [facility] due to not liking crowds. I will send referral to [place] [facility]. Encouraged more walking.  [id number]  Margaret MassonRandi Kristan Sallie Carpenter CES, ACSM 02/24/2017 3:31 PM

## 2017-02-25 MED ORDER — LORAZEPAM 1 MG PO TABS
1.0000 mg | ORAL_TABLET | Freq: Four times a day (QID) | ORAL | Status: DC | PRN
  Administered 2017-02-25 – 2017-02-26 (×2): 1 mg via ORAL
  Filled 2017-02-25 (×2): qty 1

## 2017-02-25 NOTE — Progress Notes (Signed)
CARDIAC REHAB PHASE I   PRE:  Rate/Rhythm: Sinus Rhythm 77  BP:  Supine: 100/64       SaO2: 100% Room Air   MODE:  Ambulation: 520 ft   POST:  Rate/Rhythem: 78  BP:  Supine: 111/72    SaO2: 100% Room Air  1230-1250 Patient ambulated in the hallway without complaints or chest pain. Tolerated well. Patient assisted back to bed with call light within reach.  Thayer HeadingsMaria Walden Desirai Traxler RN BSN

## 2017-02-25 NOTE — Progress Notes (Signed)
Progress Note  Patient Name: Margaret Carpenter Date of Encounter: 02/25/2017  Primary Cardiologist:  Tresa EndoKelly  Subjective   No chest pain wants to walk and shower   Inpatient Medications    Scheduled Meds: . aspirin  81 mg Oral Daily  . atorvastatin  40 mg Oral q1800  . carvedilol  6.25 mg Oral BID WC  . clopidogrel  75 mg Oral Q breakfast  . isosorbide mononitrate  30 mg Oral Daily  . sodium chloride flush  3 mL Intravenous Q12H   Continuous Infusions: . sodium chloride Stopped (02/23/17 1000)  . sodium chloride Stopped (02/23/17 1000)  . sodium chloride Stopped (02/23/17 1000)   PRN Meds: sodium chloride, acetaminophen, diazepam, ondansetron (ZOFRAN) IV, sodium chloride flush   Vital Signs    Vitals:   02/25/17 0059 02/25/17 0435 02/25/17 0700 02/25/17 0920  BP: 116/64 116/76 105/68 111/78  Pulse: 84 74 79 74  Resp: 18 18 20    Temp: 98.4 F (36.9 C) 98.4 F (36.9 C) 98.4 F (36.9 C)   TempSrc: Oral Oral Oral   SpO2: 100% 98% 100%   Weight:  136 lb 1.6 oz (61.7 kg)    Height:        Intake/Output Summary (Last 24 hours) at 02/25/2017 0925 Last data filed at 02/25/2017 0700 Gross per 24 hour  Intake 1200 ml  Output -  Net 1200 ml    I/O since admission:  +3192  Filed Weights   02/22/17 1708 02/22/17 2045 02/25/17 0435  Weight: 145 lb (65.8 kg) 139 lb 12.4 oz (63.4 kg) 136 lb 1.6 oz (61.7 kg)    Telemetry    Sinus rhythm  80 - Personally Reviewed  ECG    ECG (independently read by me): NSR at 81; IRBBB, T inversion 2, 3, aVF, QTc 532  02/22/17 ECG (independently read by me): NSR at 83; IRBBB; T wave inversion 2,3,aVF; QTc 533  Physical Exam   BP 111/78   Pulse 74   Temp 98.4 F (36.9 C) (Oral)   Resp 20   Ht 5\' 4"  (1.626 m)   Wt 136 lb 1.6 oz (61.7 kg)   LMP 02/07/2017   SpO2 100%   BMI 23.36 kg/m    Affect appropriate Healthy:  appears stated age HEENT: normal Neck supple with no adenopathy JVP normal no bruits no  thyromegaly Lungs clear with no wheezing and good diaphragmatic motion Heart:  S1/S2 no murmur, no rub, gallop or click PMI normal Abdomen: benighn, BS positve, no tenderness, no AAA no bruit.  No HSM or HJR Distal pulses intact with no bruits No edema Neuro non-focal Skin warm and dry No muscular weakness    Labs    Chemistry Recent Labs  Lab 02/22/17 1722 02/22/17 1744 02/23/17 0300 02/24/17 0320  NA 138 141 137 137  K 3.4* 3.5 3.4* 3.6  CL 100* 104 105 106  CO2 23  --  25 24  GLUCOSE 118* 116* 118* 105*  BUN 10 8 7 7   CREATININE 0.91 0.80 0.79 0.78  CALCIUM 10.1  --  8.6* 8.5*  PROT 8.9*  --   --   --   ALBUMIN 5.0  --   --   --   AST 39  --   --   --   ALT 19  --   --   --   ALKPHOS 64  --   --   --   BILITOT 1.1  --   --   --  GFRNONAA >60  --  >60 >60  GFRAA >60  --  >60 >60  ANIONGAP 15  --  7 7     Hematology Recent Labs  Lab 02/22/17 1722 02/22/17 1744 02/23/17 0300  WBC 12.5*  --  10.8*  RBC 4.52  --  3.71*  HGB 14.0 15.3* 11.0*  HCT 40.4 45.0 33.1*  MCV 89.4  --  89.2  MCH 31.0  --  29.6  MCHC 34.7  --  33.2  RDW 13.7  --  13.7  PLT 397  --  296    Cardiac Enzymes Recent Labs  Lab 02/22/17 1722 02/22/17 2106  TROPONINI 0.06* 0.54*    Recent Labs  Lab 02/22/17 1735  TROPIPOC 0.06     BNPNo results for input(s): BNP, PROBNP in the last 168 hours.   DDimer No results for input(s): DDIMER in the last 168 hours.   Lipid Panel     Component Value Date/Time   CHOL 192 02/22/2017 1721   TRIG 115 02/22/2017 1721   HDL 82 02/22/2017 1721   CHOLHDL 2.3 02/22/2017 1721   VLDL 23 02/22/2017 1721   LDLCALC 87 02/22/2017 1721    Radiology    No results found.  Cardiac Studies   ------------------------------------------------------------------- ECHO Study Conclusions  - Left ventricle: The cavity size was normal. Wall thickness was   normal. Systolic function was normal. The estimated ejection   fraction was in the range  of 50% to 55%. There is hypokinesis of   the inferolateral myocardium. Left ventricular diastolic function   parameters were normal.  Impressions:  - Hypokinesis of the inferolateral wall with overall preserved LV   systolic function; trace MR and TR.   Patient Profile     Margaret Carpenter is a 31 y.o. female with a history of who [facility] after suffering a VF cardiac arrest in the setting of inferior STEMI.  Assessment & Plan    1. Inferolateral STEMI / VF cardiac arrest: Day 2 s/p PCI to LCX. No recurrent angina. No further arrhythmia.  Off amiodarone. Coreg 6.25 mg bid  Inferolateral hypokinesis on echo with EF better at 50 - 55% from initial cath. BP too low to start post MI  ACE-I presently.  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 Obstructive vs SCAD in Ramus.  Medical therapy.  3  Hypo K: increased to 3.6 today; will give 20 meq KDUR 4. Tobacco abuse; cessation advised and counseled  5  Lipids: now on atorvastatin  6.  Anemia: Lab Results  Component Value Date   HCT 33.1 (L) 02/23/2017   Possible d/c in am   Charlton HawsPeter Marsha Gundlach

## 2017-02-26 DIAGNOSIS — I2119 ST elevation (STEMI) myocardial infarction involving other coronary artery of inferior wall: Secondary | ICD-10-CM

## 2017-02-26 LAB — BASIC METABOLIC PANEL
ANION GAP: 8 (ref 5–15)
BUN: 6 mg/dL (ref 6–20)
CALCIUM: 8.7 mg/dL — AB (ref 8.9–10.3)
CO2: 24 mmol/L (ref 22–32)
CREATININE: 0.68 mg/dL (ref 0.44–1.00)
Chloride: 104 mmol/L (ref 101–111)
Glucose, Bld: 115 mg/dL — ABNORMAL HIGH (ref 65–99)
Potassium: 3.8 mmol/L (ref 3.5–5.1)
SODIUM: 136 mmol/L (ref 135–145)

## 2017-02-26 LAB — URINALYSIS, ROUTINE W REFLEX MICROSCOPIC
BILIRUBIN URINE: NEGATIVE
Glucose, UA: NEGATIVE mg/dL
Hgb urine dipstick: NEGATIVE
KETONES UR: NEGATIVE mg/dL
Nitrite: NEGATIVE
PH: 6 (ref 5.0–8.0)
PROTEIN: NEGATIVE mg/dL
Specific Gravity, Urine: 1.008 (ref 1.005–1.030)

## 2017-02-26 LAB — MAGNESIUM: MAGNESIUM: 1.8 mg/dL (ref 1.7–2.4)

## 2017-02-26 NOTE — Progress Notes (Signed)
CSW met with pt at bedside with friend present. Pt stated that she will be living and staying with a friend after discharging.  CSW gave pt resources for food and shelters. CSW asked nurse to refer pt to nurse case manager to assist with medication.  CSW is signing off

## 2017-02-26 NOTE — Progress Notes (Signed)
Progress Note  Patient Name: Margaret Carpenter Date of Encounter: 02/26/2017  Primary Cardiologist: Dr. Tresa EndoKelly  Subjective   Denies any CP or SOB. Ambulated without chest pain  Inpatient Medications    Scheduled Meds: . aspirin  81 mg Oral Daily  . atorvastatin  40 mg Oral q1800  . carvedilol  6.25 mg Oral BID WC  . clopidogrel  75 mg Oral Q breakfast  . isosorbide mononitrate  30 mg Oral Daily  . sodium chloride flush  3 mL Intravenous Q12H   Continuous Infusions: . sodium chloride Stopped (02/23/17 1000)   PRN Meds: sodium chloride, acetaminophen, LORazepam, ondansetron (ZOFRAN) IV, sodium chloride flush   Vital Signs    Vitals:   02/25/17 1100 02/25/17 1725 02/25/17 2041 02/26/17 0500  BP: 107/64 (!) 99/58 (!) 108/58 105/65  Pulse: 84 77 86 81  Resp: 17  18 18   Temp: 98.6 F (37 C)  98.6 F (37 C) 98.4 F (36.9 C)  TempSrc: Oral  Oral Oral  SpO2: 100%  100% 100%  Weight:      Height:        Intake/Output Summary (Last 24 hours) at 02/26/2017 0939 Last data filed at 02/25/2017 2200 Gross per 24 hour  Intake 963 ml  Output -  Net 963 ml   Filed Weights   02/22/17 1708 02/22/17 2045 02/25/17 0435  Weight: 145 lb (65.8 kg) 139 lb 12.4 oz (63.4 kg) 136 lb 1.6 oz (61.7 kg)    Telemetry    NSR with 4 beats run of NSVT - Personally Reviewed  ECG    NSR with TWI in inferior leads - Personally Reviewed  Physical Exam   GEN: No acute distress.   Neck: No JVD Cardiac: RRR, no murmurs, rubs, or gallops.  Respiratory: Clear to auscultation bilaterally. GI: Soft, nontender, non-distended  MS: No edema; No deformity. Neuro:  Nonfocal  Psych: Normal affect   Labs    Chemistry Recent Labs  Lab 02/22/17 1722  02/23/17 0300 02/24/17 0320 02/26/17 0816  NA 138   < > 137 137 136  K 3.4*   < > 3.4* 3.6 3.8  CL 100*   < > 105 106 104  CO2 23  --  25 24 24   GLUCOSE 118*   < > 118* 105* 115*  BUN 10   < > 7 7 6   CREATININE 0.91   < > 0.79 0.78 0.68    CALCIUM 10.1  --  8.6* 8.5* 8.7*  PROT 8.9*  --   --   --   --   ALBUMIN 5.0  --   --   --   --   AST 39  --   --   --   --   ALT 19  --   --   --   --   ALKPHOS 64  --   --   --   --   BILITOT 1.1  --   --   --   --   GFRNONAA >60  --  >60 >60 >60  GFRAA >60  --  >60 >60 >60  ANIONGAP 15  --  7 7 8    < > = values in this interval not displayed.     Hematology Recent Labs  Lab 02/22/17 1722 02/22/17 1744 02/23/17 0300  WBC 12.5*  --  10.8*  RBC 4.52  --  3.71*  HGB 14.0 15.3* 11.0*  HCT 40.4 45.0 33.1*  MCV 89.4  --  89.2  MCH 31.0  --  29.6  MCHC 34.7  --  33.2  RDW 13.7  --  13.7  PLT 397  --  296    Cardiac Enzymes Recent Labs  Lab 02/22/17 1722 02/22/17 2106  TROPONINI 0.06* 0.54*    Recent Labs  Lab 02/22/17 1735  TROPIPOC 0.06     BNPNo results for input(s): BNP, PROBNP in the last 168 hours.   DDimer No results for input(s): DDIMER in the last 168 hours.   Radiology    No results found.  Cardiac Studies   Cath 02/22/2017 Conclusion     Ost Ramus to Ramus lesion is 75% stenosed.  Prox LAD lesion is 50% stenosed.  Prox Cx lesion is 90% stenosed.  Post intervention, there is a 0% residual stenosis.  A stent was successfully placed.  LV end diastolic pressure is mildly elevated.  There is mild left ventricular systolic dysfunction.   Acute inferolateral ST segment elevation complicated by ventricular fibrillation cardiac arrest requiring CPR, defibrillation at [facility] emergency room prior to transport to [facility] hospital with ventricular tachycardia during transport.  Patient.  Multivessel coronary artery disease  with 50% centric plaque with mild haziness in the proximal LAD, 75% haziness with systolic bridging and concern for possible scad in the ramus intermediate vessel; and 90% thrombotic appearing lesion in the proximal circumflex prior to large first marginal branch with a normal dominant RCA.  Successful PCI with PTCA/DES  stenting, and ultimate insertion of a 3.018 mm Xience Sierra stent postdilated to 3.2 mm .  With the 90% stenosis being reduced to 0%.  RECOMMENDATION: The patient will continue with DAPT for minimum of 1 year.  Will initiate concomitant medical therapy for LAD and intermediate abnormalities.  Due to concern for possible SCAD would keep in hospital for longer duration of ~ 4 days or more if necessary.     Echo 02/23/2017 LV EF: 50% -   55%  Study Conclusions  - Left ventricle: The cavity size was normal. Wall thickness was   normal. Systolic function was normal. The estimated ejection   fraction was in the range of 50% to 55%. There is hypokinesis of   the inferolateral myocardium. Left ventricular diastolic function   parameters were normal.  Impressions:  - Hypokinesis of the inferolateral wall with overall preserved LV   systolic function; trace MR and TR.   Patient Profile     31 y.o. female with a history of who was transferred from [facility] emergency room after suffering a VF cardiac arrest in the setting of inferior STEMI.    Assessment & Plan    1. Inferolateral STEMI/CAD  - cath 02/22/2017 90% prox LCx treated with DES, 50% residual prox LAD, 75% ost Ramus  - echo 02/23/2017 EF 50-55%, inferolateral wall hypokinesis  - continue coreg 6.25mg  BID and Imdur. Social worker consult as patient spend 5 days of week in [place] (only out on Tue and Wed).   2. VF arrest in the setting of STEMI.   3. Hypokalemia: pending BMET.  4. NSVT: 4 beats run overnight.   5. HLD: 40mg  daily lipitor, FLP and LFT in 6-8 weeks  6. Tobacco abuse: cessation discussed.   For questions or updates, please contact [facility] Please consult [email or web address] for contact info under Cardiology/STEMI.      Ramond Dial, PA  02/26/2017, 9:39 AM    D/C complicated by patient going to prison 5 days / week for DWI home  on Tuesday's and Wendsdays Would  Be nice to be able to go to  cardiac rehab [facility] plan d/c in am RFA cath sight fine with no hematoma  Charlton HawsPeter Shamyra Farias

## 2017-02-26 NOTE — Clinical Social Work Note (Signed)
Clinical Social Work Assessment  Patient Details  Name: Margaret Carpenter MRN: [id number] Date of Birth: 02/24/1986  Date of referral:  02/26/17               Reason for consult:  [facility]                Permission sought to share information with:  Case Estate manager/land agentManager Permission granted to share information::  Yes, Verbal Permission Granted  Name::        Agency::     Relationship::     Contact Information:     Housing/Transportation Living arrangements for the past 2 months:  No permanent address(In correctional facility 5 days a week (Thursday-Tuesday)) Source of Information:  Patient Patient Interpreter Needed:  None Criminal Activity/Legal Involvement Pertinent to Current Situation/Hospitalization:  No - Comment as needed Significant Relationships:  Friend, Parents Lives with:  Friends Do you feel safe going back to the place where you live?  Yes Need for family participation in patient care:  No (Coment)  Care giving concerns:  Pt is in jail 5 days a week (Thursday-Tuesday)   Social Worker assessment / plan:  Pt was given shelter information and food resources.  Pt will stay with friend.  Employment status:  Unemployed Health and safety inspectornsurance information:  Self Pay (Medicaid Pending) PT Recommendations:  No Follow Up Information / Referral to community resources:  Other (Comment Required)(community resources)  Patient/Family's Response to care: Pt is appreciative of CSW support and information.   Patient/Family's Understanding of and Emotional Response to Diagnosis, Current Treatment, and Prognosis:  Pt is oriented of place, time and self.  Emotional Assessment Appearance:  Appears stated age Attitude/Demeanor/Rapport:  Other(Pleasant, cooperative) Affect (typically observed):  Accepting, Appropriate Orientation:  Oriented to Self, Oriented to Situation, Oriented to Place, Oriented to  Time Alcohol / Substance use:  Not Applicable Psych involvement (Current and /or in the  community):  No (Comment)  Discharge Needs  Concerns to be addressed:  Other (Comment Required(Community resources) Readmission within the last 30 days:  No Current discharge risk:  Inadequate Financial Supports, Homeless Barriers to Discharge:  No Barriers Identified   Margaret Carpenter, LCSWA 02/26/2017, 11:23 AM

## 2017-02-26 NOTE — Progress Notes (Signed)
Pt had a run of 4 v-tach, Cardiology provider was paged gave a order for BMET and Mg and continue observation. Will continue to to monitor closely. Colleen Canesar Falecia Vannatter, RN

## 2017-02-27 DIAGNOSIS — I10 Essential (primary) hypertension: Secondary | ICD-10-CM

## 2017-02-27 MED ORDER — CARVEDILOL 6.25 MG PO TABS: 6.2500 mg | ORAL_TABLET | Freq: Two times a day (BID) | ORAL | 0 refills | Status: AC

## 2017-02-27 MED ORDER — NITROGLYCERIN 0.4 MG SL SUBL: 0.4000 mg | SUBLINGUAL_TABLET | SUBLINGUAL | 2 refills | Status: AC | PRN

## 2017-02-27 MED ORDER — ASPIRIN 81 MG PO CHEW: 81.0000 mg | CHEWABLE_TABLET | Freq: Every day | ORAL | Status: AC

## 2017-02-27 MED ORDER — ATORVASTATIN CALCIUM 40 MG PO TABS: 40.0000 mg | ORAL_TABLET | Freq: Every day | ORAL | 0 refills | Status: AC

## 2017-02-27 MED ORDER — ISOSORBIDE MONONITRATE ER 30 MG PO TB24: 30.0000 mg | ORAL_TABLET | Freq: Every day | ORAL | 0 refills | Status: DC

## 2017-02-27 MED ORDER — CLOPIDOGREL BISULFATE 75 MG PO TABS: 75.0000 mg | ORAL_TABLET | Freq: Every day | ORAL | 2 refills | Status: AC

## 2017-02-27 MED FILL — ?ATORVASTATIN 40MG TABLET: 40 | 30 days supply | Qty: 30 | Fill #0

## 2017-02-27 MED FILL — ?CARVEDILOL 6.25 MG TABLET: 6.25 | 30 days supply | Qty: 60 | Fill #0

## 2017-02-27 NOTE — Discharge Summary (Signed)
Discharge Summary    Patient ID: Margaret Carpenter,  MRN: [id number], DOB/AGE: November 01, 1985 31 y.o.  Admit date: 02/22/2017 Discharge date: 02/27/2017  Primary Care Provider: Patient, No Pcp Per Primary Cardiologist: Tresa Endo  Discharge Diagnoses    Active Problems:   ST elevation myocardial infarction (STEMI) [facility])   Ventricular fibrillation (HCC)   Cardiac arrest [facility])   STEMI involving left circumflex coronary artery (HCC)   Allergies Allergies  Allergen Reactions  . Penicillins Hives  . Hydrocodone Itching  . Morphine And Related Itching  . Percocet [Oxycodone-Acetaminophen] Itching    Diagnostic Studies/Procedures    Cath: 02/22/17  Conclusion     Ost Ramus to Ramus lesion is 75% stenosed.  Prox LAD lesion is 50% stenosed.  Prox Cx lesion is 90% stenosed.  Post intervention, there is a 0% residual stenosis.  A stent was successfully placed.  LV end diastolic pressure is mildly elevated.  There is mild left ventricular systolic dysfunction.   Acute inferolateral ST segment elevation complicated by ventricular fibrillation cardiac arrest requiring CPR, defibrillation at [facility] emergency room prior to transport to [facility] hospital with ventricular tachycardia during transport.  Patient.  Multivessel coronary artery disease  with 50% centric plaque with mild haziness in the proximal LAD, 75% haziness with systolic bridging and concern for possible scad in the ramus intermediate vessel; and 90% thrombotic appearing lesion in the proximal circumflex prior to large first marginal branch with a normal dominant RCA.  Successful PCI with PTCA/DES stenting, and ultimate insertion of a 3.018 mm Xience Sierra stent postdilated to 3.2 mm .  With the 90% stenosis being reduced to 0%.  RECOMMENDATION: The patient will continue with DAPT for minimum of 1 year.  Will initiate concomitant medical therapy for LAD and intermediate abnormalities.  Due to concern for  possible SCAD would keep in hospital for longer duration of ~ 4 days or more if necessary.   TTE: 02/23/17  Study Conclusions  - Left ventricle: The cavity size was normal. Wall thickness was   normal. Systolic function was normal. The estimated ejection   fraction was in the range of 50% to 55%. There is hypokinesis of   the inferolateral myocardium. Left ventricular diastolic function   parameters were normal.  Impressions:  - Hypokinesis of the inferolateral wall with overall preserved LV   systolic function; trace MR and TR. _____________   History of Present Illness     Ms. Margaret Carpenter is a 31 year old female who has a long-standing history of tobacco abuse since age 15.  She experience nausea and vomiting the day of admission for several hours during the afternoon and ultimately presented to [facility] emergency room.  Her ECG showed early ST elevation inferolaterally, and she developed a ventricular fibrillation cardiac arrest requiring defibrillation and CPR.  She was treated with IV amiodarone and heparin.  Code STEMI was activated.  Upon transfer to [facility] hospital.  She had an episode of VT in the ambulance.  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  Hospital Course     Underwent cardiac cath with Dr. Tresa Endo noted above with PCI/DES to the Lcx. Plan for DAPT with ASA/plavix. She was continued on IV amiodarone for a short period post cath. No further arrhythmias noted. Started on low dose BB and titrated up as tolerated. Started on high dose Lipitor. LDL 87. Of note did have  concomitant CAD in the LAD and Ramus with question of possible SCAD in the Ramus. Plan to treat with medical therapy. Follow up echo showed EF of 50-55% with hypokinesis of the inferolateral myocardium. Trace MR and TR. Troponin peaked at 0.54. Worked well with cardiac  rehab without recurrent chest pain. Seen by social work this admission om regards to her living situation and jail time. Probation officer reported she would be able to receive medications while in jail.   Landrie Beale was seen by Dr. Eldridge Dace and determined stable for discharge home. Follow up in the office has been arranged. Medications are listed below.   _____________  Discharge Vitals Blood pressure 110/64, pulse 83, temperature 98.1 F (36.7 C), temperature source Oral, resp. rate 20, height 5\' 4"  (1.626 m), weight 148 lb 8 oz (67.4 kg), last menstrual period 02/07/2017, SpO2 100 %.  Filed Weights   02/22/17 2045 02/25/17 0435 02/27/17 0637  Weight: 139 lb 12.4 oz (63.4 kg) 136 lb 1.6 oz (61.7 kg) 148 lb 8 oz (67.4 kg)    Labs & Radiologic Studies    CBC No results for input(s): WBC, NEUTROABS, HGB, HCT, MCV, PLT in the last 72 hours. Basic Metabolic Panel Recent Labs    16/10/96 0816  NA 136  K 3.8  CL 104  CO2 24  GLUCOSE 115*  BUN 6  CREATININE 0.68  CALCIUM 8.7*  MG 1.8   Liver Function Tests No results for input(s): AST, ALT, ALKPHOS, BILITOT, PROT, ALBUMIN in the last 72 hours. No results for input(s): LIPASE, AMYLASE in the last 72 hours. Cardiac Enzymes No results for input(s): CKTOTAL, CKMB, CKMBINDEX, TROPONINI in the last 72 hours. BNP Invalid input(s): POCBNP D-Dimer No results for input(s): DDIMER in the last 72 hours. Hemoglobin A1C No results for input(s): HGBA1C in the last 72 hours. Fasting Lipid Panel No results for input(s): CHOL, HDL, LDLCALC, TRIG, CHOLHDL, LDLDIRECT in the last 72 hours. Thyroid Function Tests No results for input(s): TSH, T4TOTAL, T3FREE, THYROIDAB in the last 72 hours.  Invalid input(s): FREET3 _____________  Dg Chest Portable 1 View  Result Date: 02/22/2017 CLINICAL DATA:  Acute ST elevated myocardial infarct. Status post coronary stent placement. EXAM: PORTABLE CHEST 1 VIEW COMPARISON:  07/18/2015 FINDINGS: The  heart size and mediastinal contours are within normal limits. Both lungs are clear. The visualized skeletal structures are unremarkable. IMPRESSION: No active disease. Electronically Signed   By: Myles Rosenthal M.D.   On: 02/22/2017 21:18   Disposition   Pt is being discharged home today in good condition.  Follow-up Plans & Appointments    Follow-up Information    Azalee Course, [place] Follow up on 03/09/2017.   Specialties:  Cardiology, Radiology Why:  at 1:30pm for your follow up appt.  Contact information: [address] [place] [address] [phone]        [facility]. Go on 03/21/2017.   Why:  You have an appointment with [facility] at 9:00 on Tuesday 03/21/17.  Your prescriptions can be filled there for $4-$10 each. Contact information: [address] [place] [address] [phone]         Discharge Instructions    Amb Referral to Cardiac Rehabilitation   Complete by:  As directed    Diagnosis:   Coronary Stents STEMI PTCA     Call MD for:  redness, tenderness, or signs of infection (pain, swelling, redness, odor or green/yellow discharge around incision site)  Complete by:  As directed    Diet - low sodium heart healthy   Complete by:  As directed    Discharge instructions   Complete by:  As directed    Groin Site Care Refer to this sheet in the next few weeks. These instructions provide you with information on caring for yourself after your procedure. Your caregiver may also give you more specific instructions. Your treatment has been planned according to current medical practices, but problems sometimes occur. Call your caregiver if you have any problems or questions after your procedure. HOME CARE INSTRUCTIONS You may shower 24 hours after the procedure. Remove the bandage (dressing) and gently wash the site with plain soap and water. Gently pat the site dry.  Do not apply powder  or lotion to the site.  Do not sit in a bathtub, swimming pool, or whirlpool for 5 to 7 days.  No bending, squatting, or lifting anything over 10 pounds (4.5 kg) as directed by your caregiver.  Inspect the site at least twice daily.  Do not drive home if you are discharged the same day of the procedure. Have someone else drive you.  You may drive 24 hours after the procedure unless otherwise instructed by your caregiver.  What to expect: Any bruising will usually fade within 1 to 2 weeks.  Blood that collects in the tissue (hematoma) may be painful to the touch. It should usually decrease in size and tenderness within 1 to 2 weeks.  SEEK IMMEDIATE MEDICAL CARE IF: You have unusual pain at the groin site or down the affected leg.  You have redness, warmth, swelling, or pain at the groin site.  You have drainage (other than a small amount of blood on the dressing).  You have chills.  You have a fever or persistent symptoms for more than 72 hours.  You have a fever and your symptoms suddenly get worse.  Your leg becomes pale, cool, tingly, or numb.  You have heavy bleeding from the site. Hold pressure on the site. Marland Kitchen.  PLEASE DO NOT MISS ANY DOSES OF YOUR PLAVIX!!!!! Also keep a log of you blood pressures and bring back to your follow up appt. Please call the office with any questions.   Patients taking blood thinners should generally stay away from medicines like ibuprofen, Advil, Motrin, naproxen, and Aleve due to risk of stomach bleeding. You may take Tylenol as directed or talk to your primary doctor about alternatives.   Increase activity slowly   Complete by:  As directed       Discharge Medications     Medication List    STOP taking these medications   ibuprofen 200 MG tablet Commonly known as:  ADVIL,MOTRIN   naproxen 500 MG tablet Commonly known as:  NAPROSYN     TAKE these medications   acetaminophen 500 MG tablet Commonly known as:  TYLENOL Take 500 mg by mouth every  6 (six) hours as needed for moderate pain.   aspirin 81 MG chewable tablet Chew 1 tablet (81 mg total) by mouth daily. Start taking on:  02/28/2017   atorvastatin 40 MG tablet Commonly known as:  LIPITOR Take 1 tablet (40 mg total) by mouth daily at 6 PM.   carvedilol 6.25 MG tablet Commonly known as:  COREG Take 1 tablet (6.25 mg total) by mouth 2 (two) times daily with a meal.   clopidogrel 75 MG tablet Commonly known as:  PLAVIX Take 1 tablet (75 mg total) by mouth  daily with breakfast. Start taking on:  02/28/2017   isosorbide mononitrate 30 MG 24 hr tablet Commonly known as:  IMDUR Take 1 tablet (30 mg total) by mouth daily. Start taking on:  02/28/2017   nitroGLYCERIN 0.4 MG SL tablet Commonly known as:  NITROSTAT Place 1 tablet (0.4 mg total) under the tongue every 5 (five) minutes as needed.        Aspirin prescribed at discharge?  Yes High Intensity Statin Prescribed? (Lipitor 40-80mg  or Crestor 20-40mg ): Yes Beta Blocker Prescribed? Yes For EF <40%, was ACEI/ARB Prescribed? No: EF ok ADP Receptor Inhibitor Prescribed? (i.e. Plavix etc.-Includes Medically Managed Patients): Yes For EF <40%, Aldosterone Inhibitor Prescribed? No: EF ok Was EF assessed during THIS hospitalization? Yes Was Cardiac Rehab II ordered? (Included Medically managed Patients): Yes   Outstanding Labs/Studies   FLP/LFTs in 6 weeks if tolerating statin.   Duration of Discharge Encounter   Greater than 30 minutes including physician time.  Signed, Laverda PageLindsay Roberts NP-C 02/27/2017, 2:39 PM   I have examined the patient and reviewed assessment and plan and discussed with patient.  Agree with above as stated.  Doing well post MI.  Stressed importance of controlling BP.  She states that if she stops drinking, her BP will be better. GEN: Well nourished, well developed, in no acute distress  HEENT: normal  Neck: no JVD, carotid bruits, or masses Cardiac: RRR; no murmurs, rubs, or gallops,no  edema  Respiratory:  clear to auscultation bilaterally, normal work of breathing GI: soft, nontender, nondistended,  MS: no deformity or atrophy  Skin: warm and dry, no rash; no right groin hematoma; 2+ PT pulse on the right Neuro:  Strength and sensation are intact Psych: euthymic mood, full affect  Stressed importance of compliance with DAPT. Avoid tobacco.    Lance MussJayadeep Rodrigo Mcgranahan

## 2017-02-27 NOTE — Progress Notes (Signed)
CARDIAC REHAB PHASE I   PRE:  Rate/Rhythm: 84 SR c/ occ PVC  BP:  Sitting: 117/77        SaO2: 100 RA  MODE:  Ambulation: 470 ft   POST:  Rate/Rhythm: 98 SR  BP:  Sitting: 109/71         SaO2: 98 RA  Pt ambulated 470 ft on RA, independent, steady gait, tolerated well with no complaints. Reinforced tobacco cessation, anti-platelet therapy. Pt verbalized understanding. Pt to bed per pt request after walk, call bell within reach, eager for discharge.   [phone] Joylene GrapesEmily C Nehemie Casserly, RN, BSN 02/27/2017 9:09 AM

## 2017-02-27 NOTE — Progress Notes (Signed)
Discharge Order written - instructions reviewed and given to patient along with prescriptions. [facility] as she had two medications to take tonight. Pt voiced understanding. IV and tele box removed. Pt discharged in stable condition.

## 2017-02-27 NOTE — Discharge Instructions (Signed)
Take your medications and your discharge instructions to the jail so they can dispense your medications as prescribed.     Low-Sodium Eating Plan Sodium, which is an element that makes up salt, helps you maintain a healthy balance of fluids in your body. Too much sodium can increase your blood pressure and cause fluid and waste to be held in your body. Your health care provider or dietitian may recommend following this plan if you have high blood pressure (hypertension), kidney disease, liver disease, or heart failure. Eating less sodium can help lower your blood pressure, reduce swelling, and protect your heart, liver, and kidneys. What are tips for following this plan? General guidelines  Most people on this plan should limit their sodium intake to 1,500-2,000 mg (milligrams) of sodium each day. Reading food labels  The Nutrition Facts label lists the amount of sodium in one serving of the food. If you eat more than one serving, you must multiply the listed amount of sodium by the number of servings.  Choose foods with less than 140 mg of sodium per serving.  Avoid foods with 300 mg of sodium or more per serving. Shopping  Look for lower-sodium products, often labeled as "low-sodium" or "no salt added."  Always check the sodium content even if foods are labeled as "unsalted" or "no salt added".  Buy fresh foods. ? Avoid canned foods and premade or frozen meals. ? Avoid canned, cured, or processed meats  Buy breads that have less than 80 mg of sodium per slice. Cooking  Eat more home-cooked food and less restaurant, buffet, and fast food.  Avoid adding salt when cooking. Use salt-free seasonings or herbs instead of table salt or sea salt. Check with your health care provider or pharmacist before using salt substitutes.  Cook with plant-based oils, such as canola, sunflower, or olive oil. Meal planning  When eating at a restaurant, ask that your food be prepared with less salt or  no salt, if possible.  Avoid foods that contain MSG (monosodium glutamate). MSG is sometimes added to Cong[place]inese food, bouillon, and some canned foods. What foods are recommended? The items listed may not be a complete list. Talk with your dietitian about what dietary choices are best for you. Grains Low-sodium cereals, including oats, puffed wheat and rice, and shredded wheat. Low-sodium crackers. Unsalted rice. Unsalted pasta. Low-sodium bread. Whole-grain breads and whole-grain pasta. Vegetables Fresh or frozen vegetables. "No salt added" canned vegetables. "No salt added" tomato sauce and paste. Low-sodium or reduced-sodium tomato and vegetable juice. Fruits Fresh, frozen, or canned fruit. Fruit juice. Meats and other protein foods Fresh or frozen (no salt added) meat, poultry, seafood, and fish. Low-sodium canned tuna and salmon. Unsalted nuts. Dried peas, beans, and lentils without added salt. Unsalted canned beans. Eggs. Unsalted nut butters. Dairy Milk. Soy milk. Cheese that is naturally low in sodium, such as ricotta cheese, fresh mozzarella, or Swiss cheese Low-sodium or reduced-sodium cheese. Cream cheese. Yogurt. Fats and oils Unsalted butter. Unsalted margarine with no trans fat. Vegetable oils such as canola or olive oils. Seasonings and other foods Fresh and dried herbs and spices. Salt-free seasonings. Low-sodium mustard and ketchup. Sodium-free salad dressing. Sodium-free light mayonnaise. Fresh or refrigerated horseradish. Lemon juice. Vinegar. Homemade, reduced-sodium, or low-sodium soups. Unsalted popcorn and pretzels. Low-salt or salt-free chips. What foods are not recommended? The items listed may not be a complete list. Talk with your dietitian about what dietary choices are best for you. Grains Instant hot cereals. Bread stuffing,  pancake, and biscuit mixes. Croutons. Seasoned rice or pasta mixes. Noodle soup cups. Boxed or frozen macaroni and cheese. Regular salted  crackers. Self-rising flour. Vegetables Sauerkraut, pickled vegetables, and relishes. Olives. JamaicaFrench fries. Onion rings. Regular canned vegetables (not low-sodium or reduced-sodium). Regular canned tomato sauce and paste (not low-sodium or reduced-sodium). Regular tomato and vegetable juice (not low-sodium or reduced-sodium). Frozen vegetables in sauces. Meats and other protein foods Meat or fish that is salted, canned, smoked, spiced, or pickled. Bacon, ham, sausage, hotdogs, corned beef, chipped beef, packaged lunch meats, salt pork, jerky, pickled herring, anchovies, regular canned tuna, sardines, salted nuts. Dairy Processed cheese and cheese spreads. Cheese curds. Blue cheese. Feta cheese. String cheese. Regular cottage cheese. Buttermilk. Canned milk. Fats and oils Salted butter. Regular margarine. Ghee. Bacon fat. Seasonings and other foods Onion salt, garlic salt, seasoned salt, table salt, and sea salt. Canned and packaged gravies. Worcestershire sauce. Tartar sauce. Barbecue sauce. Teriyaki sauce. Soy sauce, including reduced-sodium. Steak sauce. Fish sauce. Oyster sauce. Cocktail sauce. Horseradish that you find on the shelf. Regular ketchup and mustard. Meat flavorings and tenderizers. Bouillon cubes. Hot sauce and Tabasco sauce. Premade or packaged marinades. Premade or packaged taco seasonings. Relishes. Regular salad dressings. Salsa. Potato and tortilla chips. Corn chips and puffs. Salted popcorn and pretzels. Canned or dried soups. Pizza. Frozen entrees and pot pies. Summary  Eating less sodium can help lower your blood pressure, reduce swelling, and protect your heart, liver, and kidneys.  Most people on this plan should limit their sodium intake to 1,500-2,000 mg (milligrams) of sodium each day.  Canned, boxed, and frozen foods are high in sodium. Restaurant foods, fast foods, and pizza are also very high in sodium. You also get sodium by adding salt to food.  Try to cook at  home, eat more fresh fruits and vegetables, and eat less fast food, canned, processed, or prepared foods. This information is not intended to replace advice given to you by your health care provider. Make sure you discuss any questions you have with your health care provider. Document Released: 09/10/2001 Document Revised: 03/14/2016 Document Reviewed: 03/14/2016 Elsevier Interactive Patient Education  2017 [employer]

## 2017-02-27 NOTE — Care Management Note (Signed)
Case Management Note  Patient Details  Name: Margaret Carpenter MRN: [id number] Date of Birth: [birth year]  Subjective/Objective:       Pt presented for CP, VF arrest and STEMI.  Pt is uninsured and has no PCP.  [facility] (Thursday evening to Tuesday morning) for DUI since August. Otherwise, pt lives independently with no stable address, but will stay with friends upon discharge.  Pt plans to apply for Medicaid.  Action/Plan: Pt established with [facility].  F/u appt on Tuesday 03/21/17 at 9:00am placed on AVS.  Pt's prescriptions printed and [facility]  Spoke with pt's probation officer, Earney MalletKim Shearon [phone]) to verify that pt will receive medications while in jail.  Ms. Shearon states pt should bring medications to jail with d/c instructions and meds will be dispensed by nurse.  Pt verbalizes understanding and NP notified of plan.  Expected Discharge Date:  02/27/17               Expected Discharge Plan:  Home/Self Care  In-House Referral:  Clinical Social Work  Discharge planning Services  CM Consult, Follow-up appt scheduled, Indigent [facility], Medication Assistance  Post Acute Care Choice:  NA Choice offered to:     DME Arranged:    DME Agency:     HH Arranged:    HH Agency:     Status of Service:  Completed, signed off  If discussed at [employer] Meetings, dates discussed:    Additional Comments:  Verdene LennertGoldean, Elba Dendinger K, RN 02/27/2017, 2:38 PM

## 2017-02-28 MED FILL — ?CLOPIDOGREL 75MG TAB: 75 | 30 days supply | Qty: 30 | Fill #0

## 2017-02-28 MED FILL — NITROSTAT 0.4 MG TABLET SL: 0.4 | 25 days supply | Qty: 25 | Fill #0

## 2017-02-28 MED FILL — ISOSORBIDE MN ER 30 MG TAB: 30 | 30 days supply | Qty: 30 | Fill #0

## 2017-03-01 ENCOUNTER — Telehealth (HOSPITAL_COMMUNITY): Payer: Self-pay

## 2017-03-01 NOTE — Telephone Encounter (Signed)
Per Phase I - Patient does not like people and is also serving jail time. Closed referral.

## 2017-03-09 ENCOUNTER — Ambulatory Visit (INDEPENDENT_AMBULATORY_CARE_PROVIDER_SITE_OTHER): Payer: Self-pay | Admitting: Physician Assistant

## 2017-03-09 ENCOUNTER — Encounter: Payer: Self-pay | Admitting: Physician Assistant

## 2017-03-09 VITALS — BP 112/74 | HR 84 | Ht 64.0 in | Wt 149.6 lb

## 2017-03-09 DIAGNOSIS — Z720 Tobacco use: Secondary | ICD-10-CM

## 2017-03-09 DIAGNOSIS — E785 Hyperlipidemia, unspecified: Secondary | ICD-10-CM

## 2017-03-09 DIAGNOSIS — Z79899 Other long term (current) drug therapy: Secondary | ICD-10-CM

## 2017-03-09 DIAGNOSIS — I2119 ST elevation (STEMI) myocardial infarction involving other coronary artery of inferior wall: Secondary | ICD-10-CM

## 2017-03-09 DIAGNOSIS — I4901 Ventricular fibrillation: Secondary | ICD-10-CM

## 2017-03-09 MED ORDER — ISOSORBIDE MONONITRATE ER 30 MG PO TB24: 15.0000 mg | ORAL_TABLET | Freq: Every day | ORAL | 3 refills | Status: AC

## 2017-03-09 NOTE — Progress Notes (Signed)
Cardiology Office Note    Date:  03/11/2017   ID:  Margaret Carpenter, DOB 1985/10/30, MRN [id number]  PCP:  Patient, No Pcp Per  Cardiologist:  Dr. Tresa Endo   Chief Complaint  Patient presents with  . Hospitalization Follow-up    post PCI, seen for Dr. Tresa Endo    History of Present Illness:  Margaret Carpenter is a 31 y.o. female with PMH of tobacco abuse who was recently transferred to [facility] from [place] on 02/22/2017 after she suffered a VF arrest in the setting of inferior STEMI.  Apparently she experienced the nausea and vomiting for several hours prior to presenting to [facility].  EKG showed early ST elevation inferolaterally.  She subsequently developed VF and cardiac arrest requiring defibrillation and CPR.  She was treated with IV amiodarone and IV heparin.  Emergent cardiac catheterization on 02/22/2017 showed 75% ostial ramus lesion, 50% proximal LAD lesion, 90% proximal left circumflex lesion treated with 3.0 x 18 mm Xience DES postdilated to 3.2 mm.  Echocardiogram obtained on the following day showed EF of 50-55%, hypokinesis of the inferolateral myocardium.  During this admission, patient was started on carvedilol, 40 mg daily of Lipitor, aspirin and Plavix.  She is also placed on 30 mg daily of Imdur.  She presents today for cardiology office visit.  Unfortunately patient continued to smoke to this day with only 1-2 cigarettes/day .  She says she has been smoking since she was 13.  Given the addition of Lipitor recently, she will need 6-8 weeks fasting lipid panel and LFT.  She also have almost daily episode of headache, this is relieved with caffeine.  I will decrease her Imdur to 15 mg daily.  She denies any exertional chest pain, she does however have chest soreness, that is related to the recent CPR.  Otherwise she is doing fine from a cardiology perspective.  Main concern in this today's visit is she has not taken any aspirin since her discharge as she says she  cannot afford aspirin.  We will give her 64-month supply that would last her until the next office visit.  Otherwise she has been compliant with all the other medication.  Further emphasis has been placed on the compliance of dual antiplatelet therapy.  She has been currently instructed to call cardiology if any issues arise during the meantime.   Past Medical History:  Diagnosis Date  . Anemia   . Asthma   . Cardiac arrest with ventricular fibrillation (HCC) 02/22/2017  . Headache    "weekly" (02/23/2017)  . Hypertension   . STEMI (ST elevation myocardial infarction) (HCC) 02/22/2017    Past Surgical History:  Procedure Laterality Date  . CORONARY ANGIOPLASTY WITH STENT PLACEMENT  02/22/2017  . CORONARY/GRAFT ACUTE MI REVASCULARIZATION N/A 02/22/2017   Procedure: Coronary/Graft Acute MI Revascularization;  Surgeon: Lennette Bihari, MD;  Location: [facility] INVASIVE CV LAB;  Service: Cardiovascular;  Laterality: N/A;  . LEFT HEART CATH AND CORONARY ANGIOGRAPHY N/A 02/22/2017   Procedure: LEFT HEART CATH AND CORONARY ANGIOGRAPHY;  Surgeon: Lennette Bihari, MD;  Location: [facility] INVASIVE CV LAB;  Service: Cardiovascular;  Laterality: N/A;    Current Medications: Outpatient Medications Prior to Visit  Medication Sig Dispense Refill  . acetaminophen (TYLENOL) 500 MG tablet Take 500 mg by mouth every 6 (six) hours as needed for moderate pain.    Marland Kitchen aspirin 81 MG chewable tablet Chew 1 tablet (81 mg total) by mouth daily.    Marland Kitchen atorvastatin (  LIPITOR) 40 MG tablet Take 1 tablet (40 mg total) by mouth daily at 6 PM. 30 tablet 0  . carvedilol (COREG) 6.25 MG tablet Take 1 tablet (6.25 mg total) by mouth 2 (two) times daily with a meal. 60 tablet 0  . clopidogrel (PLAVIX) 75 MG tablet Take 1 tablet (75 mg total) by mouth daily with breakfast. 90 tablet 2  . nitroGLYCERIN (NITROSTAT) 0.4 MG SL tablet Place 1 tablet (0.4 mg total) under the tongue every 5 (five) minutes as needed. 25 tablet 2  . isosorbide  mononitrate (IMDUR) 30 MG 24 hr tablet Take 1 tablet (30 mg total) by mouth daily. 30 tablet 0   No facility-administered medications prior to visit.      Allergies:   Penicillins; Hydrocodone; Morphine and related; and Percocet [oxycodone-acetaminophen]   Social History   Socioeconomic History  . Marital status: Single    Spouse name: None  . Number of children: None  . Years of education: None  . Highest education level: None  Social Needs  . Financial resource strain: None  . Food insecurity - worry: None  . Food insecurity - inability: None  . Transportation needs - medical: None  . Transportation needs - non-medical: None  Occupational History  . None  Tobacco Use  . Smoking status: Current Every Day Smoker    Packs/day: 0.50    Years: 18.00    Pack years: 9.00    Types: Cigarettes  . Smokeless tobacco: Never Used  . Tobacco comment: pt reports she only smokes 2 a day at the most  Substance and Sexual Activity  . Alcohol use: Yes    Alcohol/week: 4.8 oz    Types: 8 Cans of beer per week  . Drug use: No  . Sexual activity: Not Currently  Other Topics Concern  . None  Social History Narrative  . None     Family History:  The patient's family history includes Heart attack in her maternal grandfather; Hypertension in her mother.   ROS:   Please see the history of present illness.    ROS All other systems reviewed and are negative.   PHYSICAL EXAM:   VS:  BP 112/74   Pulse 84   Ht 5\' 4"  (1.626 m)   Wt 149 lb 9.6 oz (67.9 kg)   LMP 02/07/2017   BMI 25.68 kg/m    GEN: Well nourished, well developed, in no acute distress  HEENT: normal  Neck: no JVD, carotid bruits, or masses Cardiac: RRR; no murmurs, rubs, or gallops,no edema  Respiratory:  clear to auscultation bilaterally, normal work of breathing GI: soft, nontender, nondistended, + BS MS: no deformity or atrophy  Skin: warm and dry, no rash Neuro:  Alert and Oriented x 3, Strength and sensation are  intact Psych: euthymic mood, full affect  Wt Readings from Last 3 Encounters:  03/09/17 149 lb 9.6 oz (67.9 kg)  02/27/17 148 lb 8 oz (67.4 kg)  07/18/15 145 lb (65.8 kg)      Studies/Labs Reviewed:   EKG:  EKG is ordered today.  The ekg ordered today demonstrates normal sinus rhythm, T wave inversion in the inferior leads.  Recent Labs: 02/22/2017: ALT 19 02/23/2017: Hemoglobin 11.0; Platelets 296 02/26/2017: BUN 6; Creatinine, Ser 0.68; Magnesium 1.8; Potassium 3.8; Sodium 136   Lipid Panel    Component Value Date/Time   CHOL 192 02/22/2017 1721   TRIG 115 02/22/2017 1721   HDL 82 02/22/2017 1721   CHOLHDL 2.3 02/22/2017  1721   VLDL 23 02/22/2017 1721   LDLCALC 87 02/22/2017 1721    Additional studies/ records that were reviewed today include:   Cath 02/22/2017 Conclusion     Ost Ramus to Ramus lesion is 75% stenosed.  Prox LAD lesion is 50% stenosed.  Prox Cx lesion is 90% stenosed.  Post intervention, there is a 0% residual stenosis.  A stent was successfully placed.  LV end diastolic pressure is mildly elevated.  There is mild left ventricular systolic dysfunction.   Acute inferolateral ST segment elevation complicated by ventricular fibrillation cardiac arrest requiring CPR, [facility] prior to [facility] with ventricular tachycardia during transport.  Patient.  Multivessel coronary artery disease  with 50% centric plaque with mild haziness in the proximal LAD, 75% haziness with systolic bridging and concern for possible scad in the ramus intermediate vessel; and 90% thrombotic appearing lesion in the proximal circumflex prior to large first marginal branch with a normal dominant RCA.  Successful PCI with PTCA/DES stenting, and ultimate insertion of a 3.018 mm Xience Sierra stent postdilated to 3.2 mm .  With the 90% stenosis being reduced to 0%.  RECOMMENDATION: The patient will continue with DAPT  for minimum of 1 year.  Will initiate concomitant medical therapy for LAD and intermediate abnormalities.  Due to concern for possible SCAD would keep in hospital for longer duration of ~ 4 days or more if necessary.     Echo 02/23/2017 LV EF: 50% -   55%  Study Conclusions  - Left ventricle: The cavity size was normal. Wall thickness was   normal. Systolic function was normal. The estimated ejection   fraction was in the range of 50% to 55%. There is hypokinesis of   the inferolateral myocardium. Left ventricular diastolic function   parameters were normal.  Impressions:  - Hypokinesis of the inferolateral wall with overall preserved LV   systolic function; trace MR and TR.   ASSESSMENT:    1. ST elevation myocardial infarction (STEMI) involving other coronary artery of inferior wall (HCC)   2. Ventricular fibrillation (HCC)   3. Medication management   4. Tobacco abuse   5. Hyperlipidemia, unspecified hyperlipidemia type      PLAN:  In order of problems listed above:  1. CAD: Recent inferior lateral STEMI, underwent DES to left circumflex.  Denies any further chest pain.  Continue aspirin and Plavix.  Unfortunately patient has not been taking aspirin since discharge.  She says she cannot afford it.  We have given her 362-month of aspirin, enough to last her until the next follow-up.  I have decreased Imdur to 15 mg daily due to headache.  Emphasis has been placed on the importance of dual antiplatelet therapy, she is aware that if she is noncompliant with aspirin and Plavix therapy, she may have restenosis of the stent.  2. Ventricular fibrillation associated cardiac arrest: Occurred in the setting of inferolateral STEMI  3. Tobacco abuse: She has cut back on smoking, however has not stopped yet.  Tobacco cessation discussed.  4. Hyperlipidemia: Continue high-dose statin, fasting lipid panel and LFT in 6-8 weeks.    Medication Adjustments/Labs and Tests  Ordered: Current medicines are reviewed at length with the patient today.  Concerns regarding medicines are outlined above.  Medication changes, Labs and Tests ordered today are listed in the Patient Instructions below. Patient Instructions  Medication Instructions:  DECREASE IMDUR 15MG  (1/2 TAB) DAILY  If you need a refill on  your cardiac medications before your next appointment, please call your pharmacy.  Labwork: FASTING LIPID AND LFT IN 6-8 HERE IN OUR OFFICE AT [facility]  Take the provided lab slips for you to take with you to the lab for you blood draw.   You will need to fast. DO NOT EAT OR DRINK PAST MIDNIGHT.   You may go to any [facility] lab that is convenient for you however, we do have a lab in our office that is able to assist you. You do NOT need an appointment for our lab. Once in our office lobby there is a podium to the right of the check-in desk where you are to sign-in and ring a doorbell to alert us you are here. Lab is open Monday-Friday from 8:00am to 4:00pm; and is closed for lunch from 12:45p-1:45pm   Follow-Up: Your physician wants you to follow-up in: 2-3 MONTHS WITH DR Tresa EndoKELLY.   Thank you for choosing [facility]!!         Signed, Azalee CourseHao Nikolas Casher, [place]  03/11/2017 1:23 PM    [facility] [address], [place], [place]  [address] Phone: [phone](336) [phone]; Fax: [phone]) [phone]

## 2017-03-09 NOTE — Patient Instructions (Signed)
Medication Instructions:  DECREASE IMDUR 15MG  (1/2 TAB) DAILY  If you need a refill on your cardiac medications before your next appointment, please call your pharmacy.  Labwork: FASTING LIPID AND LFT IN 6-8 HERE IN OUR OFFICE AT [facility]  Take the provided lab slips for you to take with you to the lab for you blood draw.   You will need to fast. DO NOT EAT OR DRINK PAST MIDNIGHT.   You may go to any [facility] lab that is convenient for you however, we do have a lab in our office that is able to assist you. You do NOT need an appointment for our lab. Once in our office lobby there is a podium to the right of the check-in desk where you are to sign-in and ring a doorbell to alert us you are here. Lab is open Monday-Friday from 8:00am to 4:00pm; and is closed for lunch from 12:45p-1:45pm   Follow-Up: Your physician wants you to follow-up in: 2-3 MONTHS WITH DR Tresa EndoKELLY.   Thank you for choosing [facility]!!

## 2017-03-11 ENCOUNTER — Encounter: Payer: Self-pay | Admitting: Physician Assistant

## 2017-03-16 ENCOUNTER — Inpatient Hospital Stay: Payer: Self-pay | Admitting: Internal Medicine

## 2017-03-21 ENCOUNTER — Inpatient Hospital Stay: Payer: Self-pay | Admitting: Internal Medicine

## 2017-03-23 ENCOUNTER — Emergency Department (HOSPITAL_COMMUNITY)
Admission: EM | Admit: 2017-03-23 | Discharge: 2017-03-24 | Disposition: A | Discharge status: Home / Self Care | Payer: Self-pay | Attending: Emergency Medicine | Admitting: Emergency Medicine

## 2017-03-23 ENCOUNTER — Emergency Department (HOSPITAL_COMMUNITY): Payer: Self-pay

## 2017-03-23 ENCOUNTER — Encounter (HOSPITAL_COMMUNITY): Payer: Self-pay | Admitting: *Deleted

## 2017-03-23 ENCOUNTER — Other Ambulatory Visit: Payer: Self-pay

## 2017-03-23 DIAGNOSIS — J45909 Unspecified asthma, uncomplicated: Secondary | ICD-10-CM | POA: Insufficient documentation

## 2017-03-23 DIAGNOSIS — Z79899 Other long term (current) drug therapy: Secondary | ICD-10-CM | POA: Insufficient documentation

## 2017-03-23 DIAGNOSIS — F17210 Nicotine dependence, cigarettes, uncomplicated: Secondary | ICD-10-CM | POA: Insufficient documentation

## 2017-03-23 DIAGNOSIS — Z955 Presence of coronary angioplasty implant and graft: Secondary | ICD-10-CM | POA: Insufficient documentation

## 2017-03-23 DIAGNOSIS — Z7902 Long term (current) use of antithrombotics/antiplatelets: Secondary | ICD-10-CM | POA: Insufficient documentation

## 2017-03-23 DIAGNOSIS — Z7982 Long term (current) use of aspirin: Secondary | ICD-10-CM | POA: Insufficient documentation

## 2017-03-23 DIAGNOSIS — I10 Essential (primary) hypertension: Secondary | ICD-10-CM | POA: Insufficient documentation

## 2017-03-23 DIAGNOSIS — I252 Old myocardial infarction: Secondary | ICD-10-CM | POA: Insufficient documentation

## 2017-03-23 DIAGNOSIS — R079 Chest pain, unspecified: Secondary | ICD-10-CM | POA: Insufficient documentation

## 2017-03-23 LAB — CBC
HCT: 37.3 % (ref 36.0–46.0)
Hemoglobin: 12.5 g/dL (ref 12.0–15.0)
MCH: 30.1 pg (ref 26.0–34.0)
MCHC: 33.5 g/dL (ref 30.0–36.0)
MCV: 89.9 fL (ref 78.0–100.0)
PLATELETS: 287 10*3/uL (ref 150–400)
RBC: 4.15 MIL/uL (ref 3.87–5.11)
RDW: 13.5 % (ref 11.5–15.5)
WBC: 10.4 10*3/uL (ref 4.0–10.5)

## 2017-03-23 LAB — I-STAT BETA HCG BLOOD, ED (MC, WL, AP ONLY)

## 2017-03-23 LAB — BASIC METABOLIC PANEL
Anion gap: 9 (ref 5–15)
BUN: 10 mg/dL (ref 6–20)
CALCIUM: 9.5 mg/dL (ref 8.9–10.3)
CHLORIDE: 103 mmol/L (ref 101–111)
CO2: 25 mmol/L (ref 22–32)
CREATININE: 0.88 mg/dL (ref 0.44–1.00)
GFR calc Af Amer: >60 mL/min (ref >60)
Glucose, Bld: 69 mg/dL (ref 65–99)
Potassium: 3.8 mmol/L (ref 3.5–5.1)
SODIUM: 137 mmol/L (ref 135–145)

## 2017-03-23 LAB — I-STAT TROPONIN, ED: TROPONIN I, POC: 0 ng/mL (ref 0.00–0.08)

## 2017-03-23 LAB — BILIRUBIN, DIRECT: Bilirubin, Direct: 0.1 mg/dL (ref 0.1–0.5)

## 2017-03-23 LAB — BILIRUBIN, TOTAL: Total Bilirubin: 1 mg/dL (ref 0.3–1.2)

## 2017-03-23 LAB — TROPONIN I: Troponin I: <0.03 ng/mL (ref <0.03)

## 2017-03-23 MED ORDER — ASPIRIN 81 MG PO CHEW
324.0000 mg | CHEWABLE_TABLET | Freq: Once | ORAL | Status: AC
  Administered 2017-03-23: 324 mg via ORAL
  Filled 2017-03-23: qty 4

## 2017-03-23 MED ORDER — NITROGLYCERIN 0.4 MG SL SUBL
0.4000 mg | SUBLINGUAL_TABLET | SUBLINGUAL | Status: DC | PRN
  Administered 2017-03-23: 0.4 mg via SUBLINGUAL
  Filled 2017-03-23: qty 1

## 2017-03-23 NOTE — ED Triage Notes (Signed)
Pt reports hx of cardiac arrest in Nov. At that time, had n/v and chest pressure. Reports still having pain since the arrest but more severe over past two days, is making her anxious.

## 2017-03-23 NOTE — ED Notes (Signed)
Dr. Mesner at bedside   

## 2017-03-23 NOTE — ED Notes (Signed)
Pt feels very lightheaded and dizzy at triage. bp 95/59.

## 2017-03-23 NOTE — ED Notes (Signed)
Dr Clayborne DanaMesner notified of BP 99/62 and he instructed to give pt one dose of nitro.

## 2017-03-23 NOTE — ED Notes (Signed)
Pt states pain is improved with nitro however feels "weird" .  Unable to describe this feeling.  Pt states no SHOB, no tingling, and improved chest pain.

## 2017-03-23 NOTE — ED Provider Notes (Signed)
Emergency Department Provider Note   I have reviewed the triage vital signs and the nursing notes.   HISTORY  Chief Complaint Chest Pain   HPI Margaret Carpenter is a 31 y.o. female anemia and recent cardiac arrest with V. fib secondary to ST elevation MI status post stent to 90% lesion in her RCA.  She is here today with chest pain.  States she has chest pain most days but last night it was much more severe than normal she took 2 nitroglycerin which made it better she was asymptomatic throughout the day then today the pain returned and she took nitro and did seem to make a little bit better but it worried her for heart so she came here for further evaluation.  She does have shortness of breath and nausea when she has it but no other associated symptoms.  No diaphoresis.  She denies any recent fever or cough. No other modifying symptoms.   Past Medical History:  Diagnosis Date  . Anemia   . Asthma   . Cardiac arrest with ventricular fibrillation (HCC) 02/22/2017  . Headache    "weekly" (02/23/2017)  . Hypertension   . STEMI (ST elevation myocardial infarction) (HCC) 02/22/2017    Patient Active Problem List   Diagnosis Date Noted  . 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 ST elevation myocardial infarction (STEMI) (HCC)   . Ventricular fibrillation (HCC)   . Cardiac arrest [facility](HCC)     Past Surgical History:  Procedure Laterality Date  . CORONARY ANGIOPLASTY WITH STENT PLACEMENT  02/22/2017  . CORONARY/GRAFT ACUTE MI REVASCULARIZATION N/A 02/22/2017   Procedure: Coronary/Graft Acute MI Revascularization;  Surgeon: Lennette BihariKelly, Thomas A, MD;  [facility] LAB;  Service: Cardiovascular;  Laterality: N/A;  . LEFT HEART CATH AND CORONARY ANGIOGRAPHY N/A 02/22/2017   Procedure: LEFT HEART CATH AND CORONARY ANGIOGRAPHY;  Surgeon: Lennette BihariKelly, Thomas A, MD;  Location: [facility] INVASIVE CV LAB;  Service: Cardiovascular;  Laterality: N/A;    Current Outpatient Rx    . Order #: [id number] Class: Historical Med  . Order #: [id number] Class: OTC  . Order #: [id number] Class: Print  . Order #: [id number] Class: Print  . Order #: [id number] Class: Print  . Order #: [id number] Class: No Print  . Order #: [id number] Class: Print    Allergies Penicillins; Hydrocodone; Morphine and related; and Percocet [oxycodone-acetaminophen]  Family History  Problem Relation Age of Onset  . Hypertension Mother   . Heart attack Maternal Grandfather     Social History Social History   Tobacco Use  . Smoking status: Current Every Day Smoker    Packs/day: 0.50    Years: 18.00    Pack years: 9.00    Types: Cigarettes  . Smokeless tobacco: Never Used  . Tobacco comment: pt reports she only smokes 2 a day at the most  Substance Use Topics  . Alcohol use: Yes    Alcohol/week: 4.8 oz    Types: 8 Cans of beer per week  . Drug use: No    Review of Systems  All other systems negative except as documented in the HPI. All pertinent positives and negatives as reviewed in the HPI. ____________________________________________   PHYSICAL EXAM:  VITAL SIGNS: ED Triage Vitals  Enc Vitals Group     BP 03/23/17 1807 (!) 95/59     Pulse Rate 03/23/17 1807 73     Resp 03/23/17 1807 18     Temp 03/23/17 1807 98.1 F (36.7 C)     Temp  Source 03/23/17 1807 Oral     SpO2 03/23/17 1807 100 %    Constitutional: Alert and oriented. Well appearing and in no acute distress. Eyes: Conjunctivae are normal. PERRL. EOMI. Head: Atraumatic. Nose: No congestion/rhinnorhea. Mouth/Throat: Mucous membranes are moist.  Oropharynx non-erythematous. Neck: No stridor.  No meningeal signs.   Cardiovascular: Normal rate, regular rhythm. Good peripheral circulation. Grossly normal heart sounds.   Respiratory: Normal respiratory effort.  No retractions. Lungs CTAB. Gastrointestinal: Soft and nontender. No distention.  Musculoskeletal: No lower extremity tenderness nor edema. No gross deformities of  extremities. Neurologic:  Normal speech and language. No gross focal neurologic deficits are appreciated.  Skin:  Skin is warm, dry and intact. No rash noted.  ____________________________________________   LABS (all labs ordered are listed, but only abnormal results are displayed)  Labs Reviewed  BASIC METABOLIC PANEL  CBC  BILIRUBIN, DIRECT  BILIRUBIN, TOTAL  TROPONIN I  I-STAT TROPONIN, ED  I-STAT BETA HCG BLOOD, ED ([facility], [facility], [facility] ONLY)   ____________________________________________  EKG   EKG Interpretation  Date/Time:  Thursday March 23 2017 19:00:39 EST Ventricular Rate:  82 PR Interval:  138 QRS Duration: 129 QT Interval:  437 QTC Calculation: 511 R Axis:   -31 Text Interpretation:  Sinus rhythm Nonspecific intraventricular conduction delay Nonspecific T abnormalities, diffuse leads When compared with ECG of EARLIER SAME DATE QT has lengthened Confirmed by Dione BoozeGlick, David ([id number]) on 03/24/2017 12:26:42 AM       ____________________________________________  RADIOLOGY  Dg Chest 2 View  Result Date: 03/23/2017 CLINICAL DATA:  Shortness of breath and chest pain EXAM: CHEST  2 VIEW COMPARISON:  February 22, 2017 FINDINGS: There is no edema or consolidation. Heart size and pulmonary vascularity are normal. No adenopathy. No pneumothorax. No bone lesions. IMPRESSION: No edema or consolidation. Electronically Signed   By: Bretta BangWilliam  Woodruff III M.D.   On: 03/23/2017 18:56    ____________________________________________   PROCEDURES  Procedure(s) performed:   Procedures   ____________________________________________   INITIAL IMPRESSION / ASSESSMENT AND PLAN / ED COURSE  H/o MI and Vfib arrest here with pain. Slight evolution of lateral changes. D/W Dr. Anne FuSkains with cardiology and with  Negative troponin and relatively normal evolution of her changes on ECG, recommends delta troponin, serial ecg's and discharge to fu w/ cardiology.  Serial ecgs stable. Normal  troponins. Cp free. Plan for discharge with cardiology follow up.  Pertinent labs & imaging results that were available during my care of the patient were reviewed by me and considered in my medical decision making (see chart for details).  ____________________________________________  FINAL CLINICAL IMPRESSION(S) / ED DIAGNOSES  Final diagnoses:  Nonspecific chest pain     MEDICATIONS GIVEN DURING THIS VISIT:  Medications  nitroGLYCERIN (NITROSTAT) SL tablet 0.4 mg (0.4 mg Sublingual Given 03/23/17 1903)  aspirin chewable tablet 324 mg (324 mg Oral Given 03/23/17 1903)     NEW OUTPATIENT MEDICATIONS STARTED DURING THIS VISIT:  This SmartLink is deprecated. Use AVSMEDLIST instead to display the medication list for a patient.  Note:  This note was prepared with assistance of Dragon voice recognition software. Occasional wrong-word or sound-a-like substitutions may have occurred due to the inherent limitations of voice recognition software.   Sharlisa Hollifield, Barbara CowerJason, MD 03/24/17 [phone]

## 2017-04-13 ENCOUNTER — Inpatient Hospital Stay: Payer: Self-pay | Admitting: Internal Medicine

## 2017-06-15 ENCOUNTER — Ambulatory Visit: Payer: Self-pay | Admitting: Cardiovascular Disease

## 2017-06-16 ENCOUNTER — Encounter: Payer: Self-pay | Admitting: *Deleted

## 2017-10-31 ENCOUNTER — Emergency Department (HOSPITAL_COMMUNITY)
Admission: EM | Admit: 2017-10-31 | Discharge: 2017-10-31 | Disposition: A | Discharge status: Home / Self Care | Payer: Self-pay | Attending: Emergency Medicine | Admitting: Emergency Medicine

## 2017-10-31 ENCOUNTER — Encounter (HOSPITAL_COMMUNITY): Payer: Self-pay | Admitting: Emergency Medicine

## 2017-10-31 DIAGNOSIS — K0889 Other specified disorders of teeth and supporting structures: Secondary | ICD-10-CM | POA: Insufficient documentation

## 2017-10-31 DIAGNOSIS — Z79899 Other long term (current) drug therapy: Secondary | ICD-10-CM | POA: Insufficient documentation

## 2017-10-31 DIAGNOSIS — I252 Old myocardial infarction: Secondary | ICD-10-CM | POA: Insufficient documentation

## 2017-10-31 DIAGNOSIS — Z7902 Long term (current) use of antithrombotics/antiplatelets: Secondary | ICD-10-CM | POA: Insufficient documentation

## 2017-10-31 DIAGNOSIS — F17210 Nicotine dependence, cigarettes, uncomplicated: Secondary | ICD-10-CM | POA: Insufficient documentation

## 2017-10-31 DIAGNOSIS — J45909 Unspecified asthma, uncomplicated: Secondary | ICD-10-CM | POA: Insufficient documentation

## 2017-10-31 DIAGNOSIS — Z7982 Long term (current) use of aspirin: Secondary | ICD-10-CM | POA: Insufficient documentation

## 2017-10-31 MED ORDER — NAPROXEN 500 MG PO TABS
500.0000 mg | ORAL_TABLET | Freq: Two times a day (BID) | ORAL | 0 refills | Status: AC
Start: 2017-10-31 — End: ?

## 2017-10-31 MED ORDER — CLINDAMYCIN HCL 150 MG PO CAPS: 450.0000 mg | ORAL_CAPSULE | Freq: Three times a day (TID) | ORAL | 0 refills | Status: AC

## 2017-10-31 MED ORDER — CLINDAMYCIN HCL 150 MG PO CAPS
450.0000 mg | ORAL_CAPSULE | Freq: Once | ORAL | Status: AC
  Administered 2017-10-31: 450 mg via ORAL
  Filled 2017-10-31: qty 3

## 2017-10-31 NOTE — ED Provider Notes (Signed)
[facility] Provider Note   CSN: [id number] Arrival date & time: 10/31/17  1756     History   Chief Complaint Chief Complaint  Patient presents with  . Dental Pain    HPI Margaret Carpenter is a 32 y.o. female with a past medical history of asthma, who presents to ED for evaluation of 3-day history of right lower dental pain.  States that she has had issues with her to back molars in the past for several months.  She has not seen a dentist "for a minute."  She has tried Excedrin with no improvement in her symptoms.  Denies any neck pain, trouble breathing or trouble swallowing, facial swelling, trismus, drooling, fever or drainage from site.  HPI  Past Medical History:  Diagnosis Date  . Anemia   . Asthma   . Cardiac arrest with ventricular fibrillation (HCC) 02/22/2017  . Headache    "weekly" (02/23/2017)  . Hypertension   . STEMI (ST elevation myocardial infarction) (HCC) 02/22/2017    Patient Active Problem List   Diagnosis Date Noted  . 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 ST elevation myocardial infarction (STEMI) (HCC)   . Ventricular fibrillation (HCC)   . Cardiac arrest [facility](HCC)     Past Surgical History:  Procedure Laterality Date  . CORONARY ANGIOPLASTY WITH STENT PLACEMENT  02/22/2017  . CORONARY/GRAFT ACUTE MI REVASCULARIZATION N/A 02/22/2017   Procedure: Coronary/Graft Acute MI Revascularization;  Surgeon: Lennette BihariKelly, Thomas A, MD;  [facility];  Service: Cardiovascular;  Laterality: N/A;  . LEFT HEART CATH AND CORONARY ANGIOGRAPHY N/A 02/22/2017   Procedure: LEFT HEART CATH AND CORONARY ANGIOGRAPHY;  Surgeon: Lennette BihariKelly, Thomas A, MD;  Location: [facility] INVASIVE CV LAB;  Service: Cardiovascular;  Laterality: N/A;     OB History   None      Home Medications    Prior to Admission medications   Medication Sig Start Date End Date Taking? Authorizing Provider  acetaminophen (TYLENOL)  500 MG tablet Take 500 mg by mouth every 6 (six) hours as needed for moderate pain.    [provider]  aspirin 81 MG chewable tablet Chew 1 tablet (81 mg total) by mouth daily. 02/28/17   Arty Baumgartneroberts, Lindsay B, NP  atorvastatin (LIPITOR) 40 MG tablet Take 1 tablet (40 mg total) by mouth daily at 6 PM. 02/27/17   Arty Baumgartneroberts, Lindsay B, NP  carvedilol (COREG) 6.25 MG tablet Take 1 tablet (6.25 mg total) by mouth 2 (two) times daily with a meal. 02/27/17   Arty Baumgartneroberts, Lindsay B, NP  clindamycin (CLEOCIN) 150 MG capsule Take 3 capsules (450 mg total) by mouth 3 (three) times daily for 7 days. 10/31/17 11/07/17  Dietrich PatesKhatri, Avonda Toso, PA-C  clopidogrel (PLAVIX) 75 MG tablet Take 1 tablet (75 mg total) by mouth daily with breakfast. 02/28/17   Laverda Pageoberts, Lindsay B, NP  isosorbide mononitrate (IMDUR) 30 MG 24 hr tablet Take 0.5 tablets (15 mg total) by mouth daily. 03/09/17   Azalee CourseMeng, Hao, PA  naproxen (NAPROSYN) 500 MG tablet Take 1 tablet (500 mg total) by mouth 2 (two) times daily. 10/31/17   Milina Pagett, PA-C  nitroGLYCERIN (NITROSTAT) 0.4 MG SL tablet Place 1 tablet (0.4 mg total) under the tongue every 5 (five) minutes as needed. 02/27/17   Arty Baumgartneroberts, Lindsay B, NP    Family History Family History  Problem Relation Age of Onset  . Hypertension Mother   . Heart attack Maternal Grandfather     Social  History Social History   Tobacco Use  . Smoking status: Current Every Day Smoker    Packs/day: 0.10    Years: 18.00    Pack years: 1.80    Types: Cigarettes  . Smokeless tobacco: Never Used  . Tobacco comment: pt reports she only smokes 2 a day at the most  Substance Use Topics  . Alcohol use: Yes    Alcohol/week: 4.8 oz    Types: 8 Cans of beer per week  . Drug use: No     Allergies   Penicillins; Amoxicillin; Hydrocodone; Morphine and related; and Percocet [oxycodone-acetaminophen]   Review of Systems Review of Systems  Constitutional: Negative for chills and fever.  HENT: Positive for dental  problem. Negative for drooling, facial swelling, rhinorrhea and sinus pressure.   Respiratory: Negative for shortness of breath.   Musculoskeletal: Negative for neck pain.     Physical Exam Updated Vital Signs BP 113/84 (BP Location: Right Arm)   Pulse 91   Temp 98.7 F (37.1 C) (Oral)   Resp 18   LMP 10/04/2017   SpO2 99%   Physical Exam  Constitutional: She appears well-developed and well-nourished. No distress.  HENT:  Head: Normocephalic and atraumatic.  Mouth/Throat: She does not have dentures. No oral lesions. No trismus in the jaw. Abnormal dentition. Dental caries present. No dental abscesses, uvula swelling or lacerations.    Tenderness to palpation of the indicated teeth with no gross dental abscess or site of drainage at this time. No facial, neck or cheek swelling noted. No pooling of secretions or trismus.  Normal voice noted with no difficulty swallowing or breathing.  No submandibular edema, erythema or crepitus noted.  Eyes: Conjunctivae and EOM are normal. No scleral icterus.  Neck: Normal range of motion.  Pulmonary/Chest: Effort normal. No respiratory distress.  Neurological: She is alert.  Skin: No rash noted. She is not diaphoretic.  Psychiatric: She has a normal mood and affect.  Nursing note and vitals reviewed.    ED Treatments / Results  Labs (all labs ordered are listed, but only abnormal results are displayed) Labs Reviewed - No data to display  EKG None  Radiology No results found.  Procedures Dental Block Date/Time: 10/31/2017 6:41 PM Performed by: Dietrich Pates, PA-C Authorized by: Dietrich Pates, PA-C   Consent:    Consent obtained:  Verbal   Consent given by:  Patient   Risks discussed:  Allergic reaction, hematoma, infection, intravascular injection, nerve damage, pain, swelling and unsuccessful block Indications:    Indications: dental pain   Location:    Block type:  Inferior alveolar   Laterality:  Right Procedure details  (see MAR for exact dosages):    Anesthetic injected:  Bupivacaine 0.5% WITH epi   Injection procedure:  Anatomic landmarks identified Post-procedure details:    Outcome:  Pain relieved   Patient tolerance of procedure:  Tolerated well, no immediate complications   (including critical care time)  Medications Ordered in ED Medications  clindamycin (CLEOCIN) capsule 450 mg (has no administration in time range)     Initial Impression / Assessment and Plan / ED Course  I have reviewed the triage vital signs and the nursing notes.  Pertinent labs & imaging results that were available during my care of the patient were reviewed by me and considered in my medical decision making (see chart for details).     Patient with dentalgia. On exam, there is no evidence of a drainable abscess. No trismus, glossal elevation, unilateral tonsillar  swelling. No evidence of retropharyngeal or peritonsillar abscess or Ludwig angina. Will treat with  clindamycin, naproxen.   Dental block completed.  Pt instructed to follow-up with dentist as soon as possible. Resource guide provided with AVS.  Portions of this note were generated with Scientist, clinical (histocompatibility and immunogenetics). Dictation errors may occur despite best attempts at proofreading.    Final Clinical Impressions(s) / ED Diagnoses   Final diagnoses:  Pain, dental    ED Discharge Orders        Ordered    clindamycin (CLEOCIN) 150 MG capsule  3 times daily     10/31/17 1838    naproxen (NAPROSYN) 500 MG tablet  2 times daily     10/31/17 1838       Dietrich Pates, PA-C 10/31/17 1843    Azalia Bilis, MD 11/01/17 [phone]

## 2017-10-31 NOTE — ED Notes (Signed)
Patient able to ambulate independently.  Given Good Rx card and print out

## 2017-10-31 NOTE — Discharge Instructions (Addendum)
Return to ED for worsening symptoms, trouble breathing or trouble swallowing, chest pain, shortness of breath, neck pain.

## 2017-10-31 NOTE — ED Triage Notes (Signed)
Pt presents to ED for 3 days of right lower dental pain

## 2017-10-31 NOTE — ED Notes (Signed)
Registration at bedside.

## 2019-08-07 ENCOUNTER — Other Ambulatory Visit: Payer: Self-pay

## 2019-08-07 ENCOUNTER — Encounter (HOSPITAL_BASED_OUTPATIENT_CLINIC_OR_DEPARTMENT_OTHER): Payer: Self-pay

## 2019-08-07 ENCOUNTER — Emergency Department (HOSPITAL_BASED_OUTPATIENT_CLINIC_OR_DEPARTMENT_OTHER)
Admission: EM | Admit: 2019-08-07 | Discharge: 2019-08-07 | Disposition: A | Discharge status: Home / Self Care | Payer: Self-pay | Attending: Emergency Medicine | Admitting: Emergency Medicine

## 2019-08-07 DIAGNOSIS — Z880 Allergy status to penicillin: Secondary | ICD-10-CM | POA: Insufficient documentation

## 2019-08-07 DIAGNOSIS — I252 Old myocardial infarction: Secondary | ICD-10-CM | POA: Insufficient documentation

## 2019-08-07 DIAGNOSIS — Z7902 Long term (current) use of antithrombotics/antiplatelets: Secondary | ICD-10-CM | POA: Insufficient documentation

## 2019-08-07 DIAGNOSIS — Z9861 Coronary angioplasty status: Secondary | ICD-10-CM | POA: Insufficient documentation

## 2019-08-07 DIAGNOSIS — J45909 Unspecified asthma, uncomplicated: Secondary | ICD-10-CM | POA: Insufficient documentation

## 2019-08-07 DIAGNOSIS — Z79899 Other long term (current) drug therapy: Secondary | ICD-10-CM | POA: Insufficient documentation

## 2019-08-07 DIAGNOSIS — F17210 Nicotine dependence, cigarettes, uncomplicated: Secondary | ICD-10-CM | POA: Insufficient documentation

## 2019-08-07 DIAGNOSIS — Z885 Allergy status to narcotic agent status: Secondary | ICD-10-CM | POA: Insufficient documentation

## 2019-08-07 DIAGNOSIS — Z7982 Long term (current) use of aspirin: Secondary | ICD-10-CM | POA: Insufficient documentation

## 2019-08-07 DIAGNOSIS — K0889 Other specified disorders of teeth and supporting structures: Secondary | ICD-10-CM | POA: Insufficient documentation

## 2019-08-07 MED ORDER — IBUPROFEN 400 MG PO TABS
600.0000 mg | ORAL_TABLET | Freq: Once | ORAL | Status: AC
  Administered 2019-08-07: 600 mg via ORAL
  Filled 2019-08-07: qty 1

## 2019-08-07 MED ORDER — CLINDAMYCIN HCL 150 MG PO CAPS: 150.0000 mg | ORAL_CAPSULE | Freq: Four times a day (QID) | ORAL | 0 refills | Status: DC

## 2019-08-07 MED ORDER — HYDROCODONE-ACETAMINOPHEN 5-325 MG PO TABS
1.0000 | ORAL_TABLET | Freq: Once | ORAL | Status: AC
  Administered 2019-08-07: 1 via ORAL
  Filled 2019-08-07: qty 1

## 2019-08-07 MED FILL — CLINDAMYCIN HCL 150 MG CAPS: 150 | 7 days supply | Qty: 28 | Fill #0

## 2019-08-07 NOTE — ED Provider Notes (Signed)
[facility] EMERGENCY DEPARTMENT Provider Note   CSN: [id number] Arrival date & time: 08/07/19  1538  History Chief Complaint  Patient presents with  . Dental Pain    Margaret Carpenter is a 34 y.o. female.  HPI   34yF with dental pain. R lower molar. Has been problematic for her for the past year or so. Significantly worse in the past week. Has dental appointment on 08/12/19 but came in today because pain now severe. Has been taking motrin with only mild improvement. No swelling. No fever or chills.  Past Medical History:  Diagnosis Date  . Anemia   . Asthma   . Cardiac arrest with ventricular fibrillation (HCC) 02/22/2017  . Headache    "weekly" (02/23/2017)  . Hypertension   . STEMI (ST elevation myocardial infarction) (HCC) 02/22/2017    Patient Active Problem List   Diagnosis Date Noted  . 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 ST elevation myocardial infarction (STEMI) (HCC)   . Ventricular fibrillation (HCC)   . Cardiac arrest [facility])     Past Surgical History:  Procedure Laterality Date  . CORONARY ANGIOPLASTY WITH STENT PLACEMENT  02/22/2017  . CORONARY/GRAFT ACUTE MI REVASCULARIZATION N/A 02/22/2017   Procedure: Coronary/Graft Acute MI Revascularization;  Surgeon: Lennette Bihari, MD;  Location: [facility] INVASIVE CV LAB;  Service: Cardiovascular;  Laterality: N/A;  . LEFT HEART CATH AND CORONARY ANGIOGRAPHY N/A 02/22/2017   Procedure: LEFT HEART CATH AND CORONARY ANGIOGRAPHY;  Surgeon: Lennette Bihari, MD;  Location: [facility] INVASIVE CV LAB;  Service: Cardiovascular;  Laterality: N/A;     OB History   No obstetric history on file.    Family History  Problem Relation Age of Onset  . Hypertension Mother   . Heart attack Maternal Grandfather    Social History   Tobacco Use  . Smoking status: Current Every Day Smoker    Packs/day: 0.10    Years: 18.00    Pack years: 1.80    Types: Cigarettes  . Smokeless tobacco: Never Used    Substance Use Topics  . Alcohol use: Yes    Comment: occ  . Drug use: No   Home Medications Prior to Admission medications   Medication Sig Start Date End Date Taking? Authorizing Provider  acetaminophen (TYLENOL) 500 MG tablet Take 500 mg by mouth every 6 (six) hours as needed for moderate pain.    [provider]  aspirin 81 MG chewable tablet Chew 1 tablet (81 mg total) by mouth daily. 02/28/17   Arty Baumgartner, NP  atorvastatin (LIPITOR) 40 MG tablet Take 1 tablet (40 mg total) by mouth daily at 6 PM. 02/27/17   Arty Baumgartner, NP  carvedilol (COREG) 6.25 MG tablet Take 1 tablet (6.25 mg total) by mouth 2 (two) times daily with a meal. 02/27/17   Arty Baumgartner, NP  clindamycin (CLEOCIN) 150 MG capsule Take 1 capsule (150 mg total) by mouth every 6 (six) hours. 08/07/19   Raeford Razor, MD  clopidogrel (PLAVIX) 75 MG tablet Take 1 tablet (75 mg total) by mouth daily with breakfast. 02/28/17   Laverda Page B, NP  isosorbide mononitrate (IMDUR) 30 MG 24 hr tablet Take 0.5 tablets (15 mg total) by mouth daily. 03/09/17   Azalee Course, PA  naproxen (NAPROSYN) 500 MG tablet Take 1 tablet (500 mg total) by mouth 2 (two) times daily. 10/31/17   Khatri, Hina, PA-C  nitroGLYCERIN (NITROSTAT) 0.4 MG SL tablet Place 1 tablet (0.4 mg  total) under the tongue every 5 (five) minutes as needed. 02/27/17   Cheryln Manly, NP    Allergies    Penicillins, Amoxicillin, Hydrocodone, Morphine and related, and Percocet [oxycodone-acetaminophen]  Review of Systems   Review of Systems All systems reviewed and negative, other than as noted in HPI.  Physical Exam Updated Vital Signs BP (!) 179/117 (BP Location: Left Arm)   Pulse 88   Temp 98.5 F (36.9 C) (Oral)   Resp 16   Ht 5\' 3"  (1.6 m)   Wt 69.1 kg   LMP 08/07/2019   SpO2 100%   BMI 27.00 kg/m   Physical Exam Vitals and nursing note reviewed.  Constitutional:      General: She is not in acute distress.     Appearance: She is well-developed.  HENT:     Head: Normocephalic and atraumatic.     Comments: Extensive dental decay. R lower molar cracked. No drainable collection. Submental tissues soft. No trismus. Neck supple. Normal sounding voice.  Eyes:     General:        Right eye: No discharge.        Left eye: No discharge.     Conjunctiva/sclera: Conjunctivae normal.  Cardiovascular:     Rate and Rhythm: Normal rate and regular rhythm.     Heart sounds: Normal heart sounds. No murmur. No friction rub. No gallop.   Pulmonary:     Effort: Pulmonary effort is normal. No respiratory distress.     Breath sounds: Normal breath sounds.  Abdominal:     General: There is no distension.     Palpations: Abdomen is soft.     Tenderness: There is no abdominal tenderness.  Musculoskeletal:        General: No tenderness.     Cervical back: Neck supple.  Skin:    General: Skin is warm and dry.  Neurological:     Mental Status: She is alert.  Psychiatric:        Behavior: Behavior normal.        Thought Content: Thought content normal.    ED Results / Procedures / Treatments   Labs (all labs ordered are listed, but only abnormal results are displayed) Labs Reviewed - No data to display  EKG None  Radiology No results found.  Procedures Procedures (including critical care time)  Medications Ordered in ED Medications  HYDROcodone-acetaminophen (NORCO/VICODIN) 5-325 MG per tablet 1 tablet (1 tablet Oral Given 08/07/19 1603)  ibuprofen (ADVIL) tablet 600 mg (600 mg Oral Given 08/07/19 1603)    ED Course  I have reviewed the triage vital signs and the nursing notes.  Pertinent labs & imaging results that were available during my care of the patient were reviewed by me and considered in my medical decision making (see chart for details).    MDM Rules/Calculators/A&P                      34yF with dental pain. No particularly concerning findings of deep space infection. Declined dental  block. Given dose of vicodin in ED. PRN NSAIDs. Clindamycin. FU with dentist as previously scheduled.   Final Clinical Impression(s) / ED Diagnoses Final diagnoses:  Pain, dental    Rx / DC Orders ED Discharge Orders         Ordered    clindamycin (CLEOCIN) 150 MG capsule  Every 6 hours     08/07/19 1601  Raeford Razor, MD 08/07/19 1714

## 2019-08-07 NOTE — ED Triage Notes (Addendum)
Pt c/o "infected tooth" bottom right x 1 year-has appt "urgent dental"  to have tooth pulled 5/10-NAD-steady gait

## 2019-08-07 NOTE — Discharge Instructions (Signed)
Keep taking ibuprofen/motrin 600 mg every 6 hours as needed for the pain.

## 2019-11-14 IMAGING — DX DG CHEST 2V
2 series · 2 of 2 positions shown · non-contrast
Comparison: February 22, 2017

CLINICAL DATA: Shortness of breath and chest pain

EXAM:
CHEST  2 VIEW

[x chest ap]
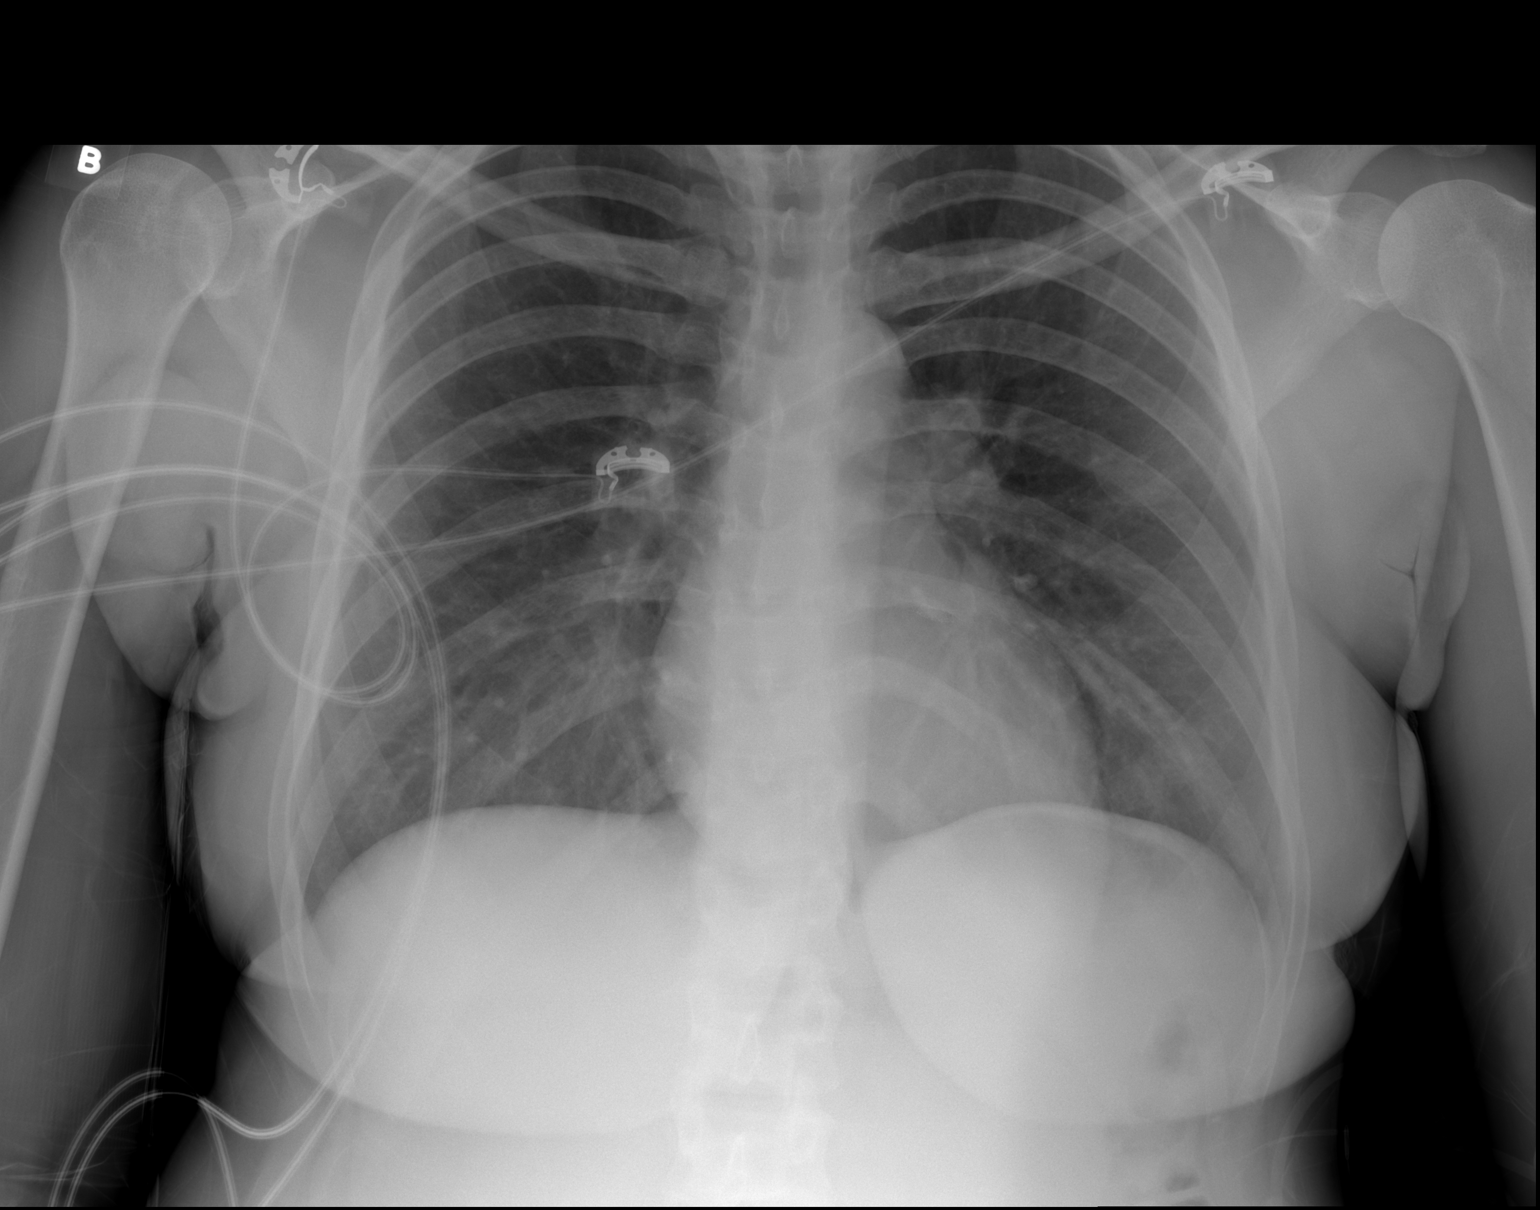

[w chest lat]
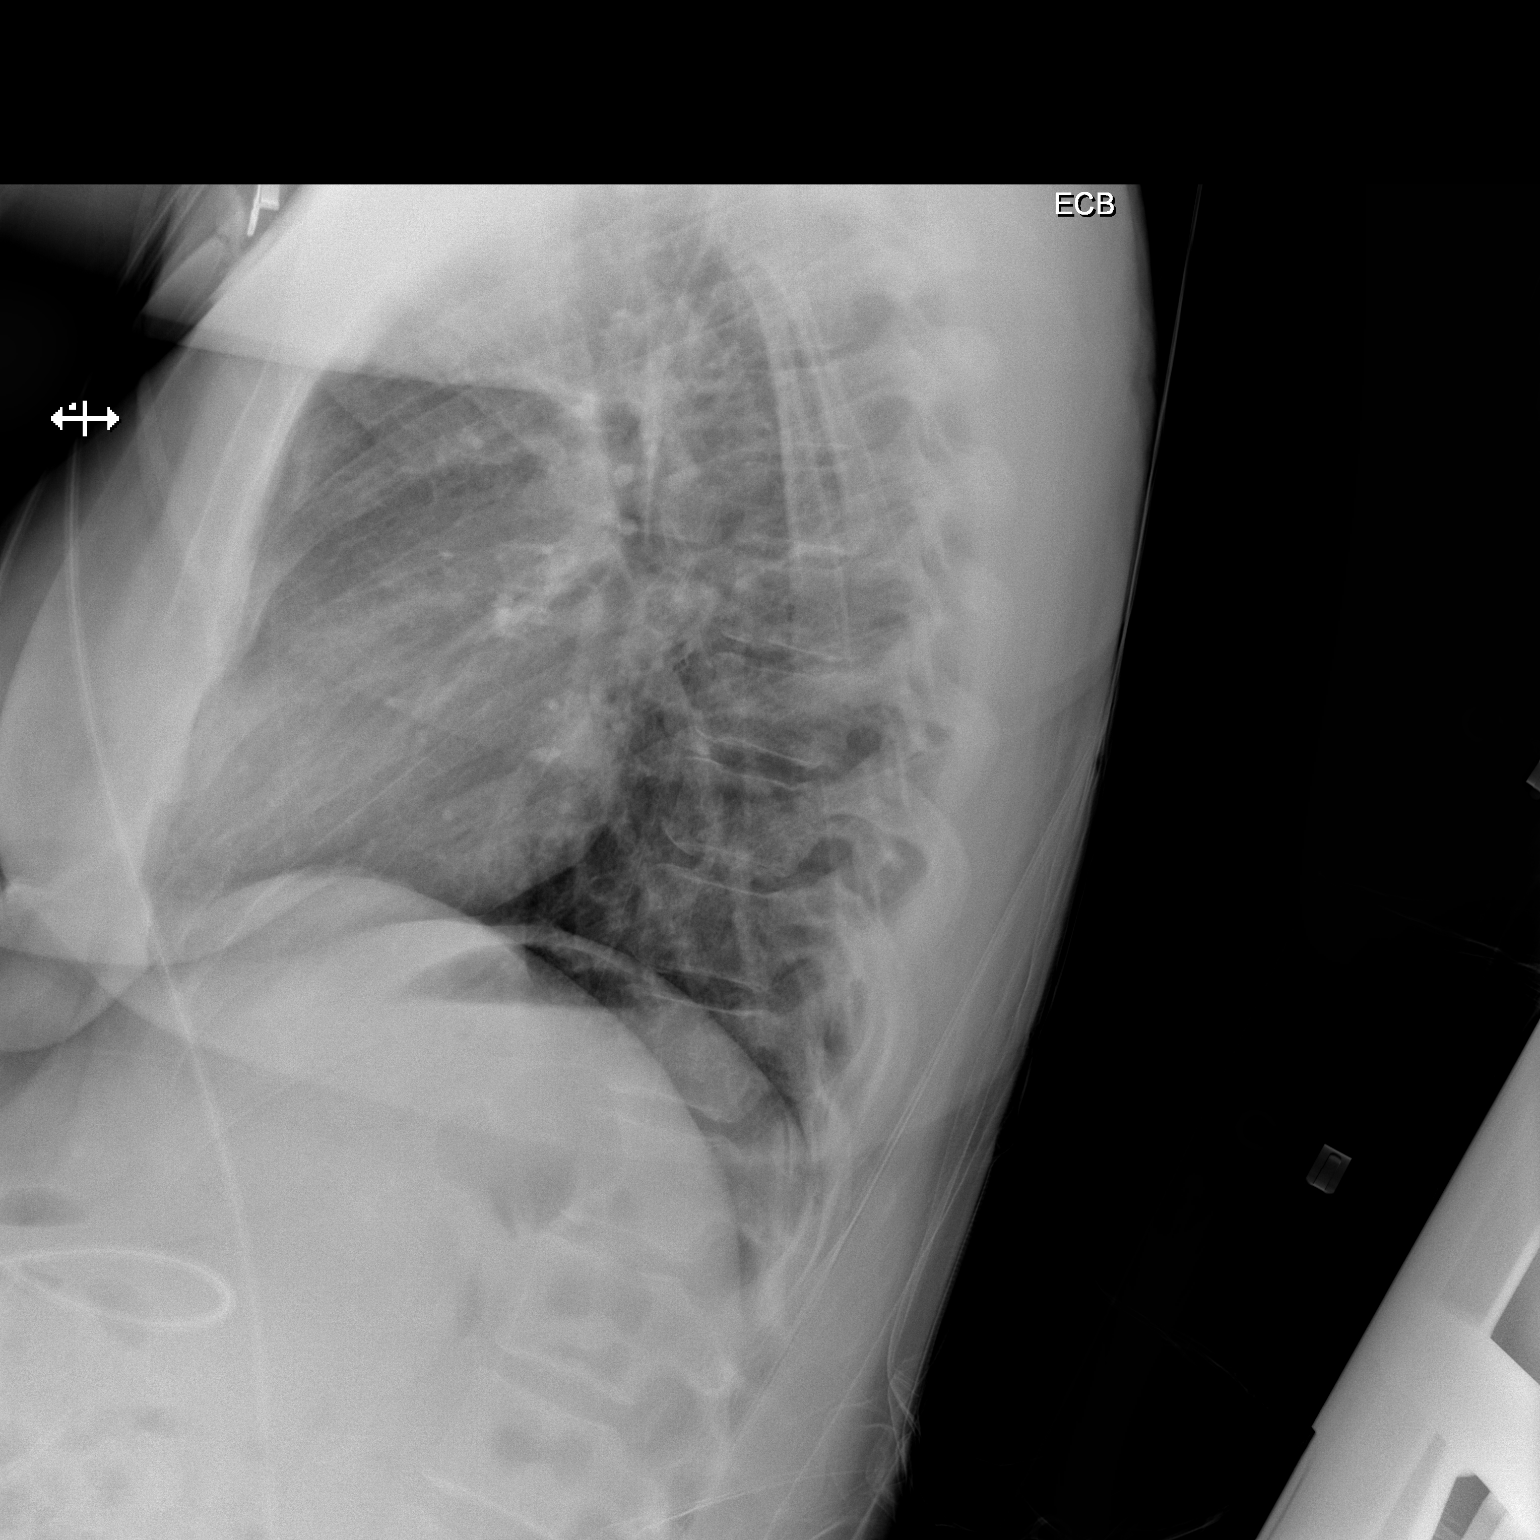

[2 of 2 positions shown; findings below may reference images not displayed]

FINDINGS: There is no edema or consolidation. Heart size and pulmonary
vascularity are normal. No adenopathy. No pneumothorax. No bone
lesions.
IMPRESSION: No edema or consolidation.

## 2020-03-26 ENCOUNTER — Other Ambulatory Visit (HOSPITAL_BASED_OUTPATIENT_CLINIC_OR_DEPARTMENT_OTHER): Payer: Self-pay | Admitting: Student

## 2020-03-26 ENCOUNTER — Other Ambulatory Visit: Payer: Self-pay

## 2020-03-26 ENCOUNTER — Emergency Department (HOSPITAL_BASED_OUTPATIENT_CLINIC_OR_DEPARTMENT_OTHER)
Admission: EM | Admit: 2020-03-26 | Discharge: 2020-03-26 | Disposition: A | Discharge status: Home / Self Care | Payer: Self-pay | Attending: Emergency Medicine | Admitting: Emergency Medicine

## 2020-03-26 ENCOUNTER — Encounter (HOSPITAL_BASED_OUTPATIENT_CLINIC_OR_DEPARTMENT_OTHER): Payer: Self-pay | Admitting: Emergency Medicine

## 2020-03-26 DIAGNOSIS — I10 Essential (primary) hypertension: Secondary | ICD-10-CM | POA: Insufficient documentation

## 2020-03-26 DIAGNOSIS — X58XXXA Exposure to other specified factors, initial encounter: Secondary | ICD-10-CM | POA: Insufficient documentation

## 2020-03-26 DIAGNOSIS — S025XXA Fracture of tooth (traumatic), initial encounter for closed fracture: Secondary | ICD-10-CM | POA: Insufficient documentation

## 2020-03-26 DIAGNOSIS — Z7982 Long term (current) use of aspirin: Secondary | ICD-10-CM | POA: Insufficient documentation

## 2020-03-26 DIAGNOSIS — Z951 Presence of aortocoronary bypass graft: Secondary | ICD-10-CM | POA: Insufficient documentation

## 2020-03-26 DIAGNOSIS — J45909 Unspecified asthma, uncomplicated: Secondary | ICD-10-CM | POA: Insufficient documentation

## 2020-03-26 DIAGNOSIS — Z79899 Other long term (current) drug therapy: Secondary | ICD-10-CM | POA: Insufficient documentation

## 2020-03-26 DIAGNOSIS — F17210 Nicotine dependence, cigarettes, uncomplicated: Secondary | ICD-10-CM | POA: Insufficient documentation

## 2020-03-26 DIAGNOSIS — Z7902 Long term (current) use of antithrombotics/antiplatelets: Secondary | ICD-10-CM | POA: Insufficient documentation

## 2020-03-26 DIAGNOSIS — K047 Periapical abscess without sinus: Secondary | ICD-10-CM

## 2020-03-26 DIAGNOSIS — I2510 Atherosclerotic heart disease of native coronary artery without angina pectoris: Secondary | ICD-10-CM | POA: Insufficient documentation

## 2020-03-26 MED ORDER — HYDROCODONE-ACETAMINOPHEN 5-325 MG PO TABS
1.0000 | ORAL_TABLET | Freq: Once | ORAL | Status: AC
  Administered 2020-03-26: 13:00:00 1 via ORAL
  Filled 2020-03-26: qty 1

## 2020-03-26 MED ORDER — CLINDAMYCIN HCL 300 MG PO CAPS: 300.0000 mg | ORAL_CAPSULE | Freq: Four times a day (QID) | ORAL | 0 refills | Status: AC

## 2020-03-26 MED ORDER — IBUPROFEN 400 MG PO TABS
600.0000 mg | ORAL_TABLET | Freq: Once | ORAL | Status: AC
  Administered 2020-03-26: 13:00:00 600 mg via ORAL
  Filled 2020-03-26: qty 1

## 2020-03-26 MED FILL — CLINDAMYCIN HCL 300 MG CAP: 300 | 7 days supply | Qty: 28 | Fill #0

## 2020-03-26 NOTE — ED Provider Notes (Signed)
[facility] EMERGENCY DEPARTMENT Provider Note   CSN: [id number] Arrival date & time: 03/26/20  1103     History Chief Complaint  Patient presents with  . Dental Problem    Margaret Carpenter is a 34 y.o. female who presents with concern for dental pain and right cheek swelling x2 days.  She states she has chronic issues with infections in this same tooth, states that she cannot afford extractions as recommended by her dentist, she continues to have infections. She denies fevers or chills at home, nausea, vomiting, diarrhea.  She states that she has been drinking water, but has been too painful to eat.  She states she has taken leftover clindamycin from last time she had an infection but has not utilized any pain medication at home.  She states she went to [facility] yesterday, however did not want to wait several hours in the emergency department to be seen so she left prior to being evaluated.  Personally reviewed the patient's medical records.  Patient with history of multiple STEMI's secondary to coronary artery disease, multiple stents placed.  She states that since onset of her heart problems her teeth have been getting worse.  History of hypertension, anemia.  HPI     Past Medical History:  Diagnosis Date  . Anemia   . Asthma   . Cardiac arrest with ventricular fibrillation (HCC) 02/22/2017  . Headache    "weekly" (02/23/2017)  . Hypertension   . STEMI (ST elevation myocardial infarction) (HCC) 02/22/2017    Patient Active Problem List   Diagnosis Date Noted  . 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 ST elevation myocardial infarction (STEMI) (HCC)   . Ventricular fibrillation (HCC)   . Cardiac arrest [facility])     Past Surgical History:  Procedure Laterality Date  . CORONARY ANGIOPLASTY WITH STENT PLACEMENT  02/22/2017  . CORONARY/GRAFT ACUTE MI REVASCULARIZATION N/A 02/22/2017   Procedure: Coronary/Graft Acute MI  Revascularization;  Surgeon: Lennette Bihari, MD;  Location: [facility] INVASIVE CV LAB;  Service: Cardiovascular;  Laterality: N/A;  . LEFT HEART CATH AND CORONARY ANGIOGRAPHY N/A 02/22/2017   Procedure: LEFT HEART CATH AND CORONARY ANGIOGRAPHY;  Surgeon: Lennette Bihari, MD;  Location: [facility] INVASIVE CV LAB;  Service: Cardiovascular;  Laterality: N/A;     OB History   No obstetric history on file.     Family History  Problem Relation Age of Onset  . Hypertension Mother   . Heart attack Maternal Grandfather     Social History   Tobacco Use  . Smoking status: Current Every Day Smoker    Packs/day: 0.10    Years: 18.00    Pack years: 1.80    Types: Cigarettes  . Smokeless tobacco: Never Used  Vaping Use  . Vaping Use: Former  Substance Use Topics  . Alcohol use: Yes    Comment: weekly  . Drug use: No    Home Medications Prior to Admission medications   Medication Sig Start Date End Date Taking? Authorizing Provider  aspirin 81 MG chewable tablet Chew 1 tablet (81 mg total) by mouth daily. 02/28/17  Yes Arty Baumgartner, NP  carvedilol (COREG) 6.25 MG tablet Take 1 tablet (6.25 mg total) by mouth 2 (two) times daily with a meal. 02/27/17  Yes Laverda Page B, NP  nitroGLYCERIN (NITROSTAT) 0.4 MG SL tablet Place 1 tablet (0.4 mg total) under the tongue every 5 (five) minutes as needed. 02/27/17  Yes Arty Baumgartner, NP  acetaminophen (TYLENOL) 500 MG tablet Take 500 mg by mouth every 6 (six) hours as needed for moderate pain.    [provider]  atorvastatin (LIPITOR) 40 MG tablet Take 1 tablet (40 mg total) by mouth daily at 6 PM. 02/27/17   Arty Baumgartner, NP  clindamycin (CLEOCIN) 300 MG capsule Take 1 capsule (300 mg total) by mouth 4 (four) times daily for 7 days. 03/26/20 04/02/20  Donya Hitch, Eugene Gavia, PA-C  clopidogrel (PLAVIX) 75 MG tablet Take 1 tablet (75 mg total) by mouth daily with breakfast. 02/28/17   Laverda Page B, NP  isosorbide mononitrate  (IMDUR) 30 MG 24 hr tablet Take 0.5 tablets (15 mg total) by mouth daily. 03/09/17   Azalee Course, PA  naproxen (NAPROSYN) 500 MG tablet Take 1 tablet (500 mg total) by mouth 2 (two) times daily. 10/31/17   Khatri, Hina, PA-C    Allergies    Penicillins, Amoxicillin, Hydrocodone, Morphine and related, and Percocet [oxycodone-acetaminophen]  Review of Systems   Review of Systems  Constitutional: Negative for activity change, appetite change, chills, diaphoresis, fatigue and fever.  HENT: Positive for dental problem. Negative for congestion, trouble swallowing and voice change.   Eyes: Negative.  Negative for visual disturbance.  Respiratory: Negative for chest tightness, shortness of breath and wheezing.   Cardiovascular: Negative for chest pain, palpitations and leg swelling.  Gastrointestinal: Negative for abdominal pain, nausea and vomiting.  Genitourinary: Negative.   Skin: Negative.   Psychiatric/Behavioral: Negative.     Physical Exam Updated Vital Signs BP (!) 162/120 (BP Location: Left Arm)   Pulse (!) 105   Temp 98.8 F (37.1 C) (Oral)   Resp 18   LMP 02/24/2020   SpO2 100%   Physical Exam Vitals and nursing note reviewed.  HENT:     Head: Normocephalic and atraumatic.      Right Ear: External ear normal.     Left Ear: External ear normal.     Nose: Nose normal.     Mouth/Throat:     Lips: Pink.     Mouth: Mucous membranes are moist.     Dentition: Abnormal dentition. Gingival swelling, dental caries and dental abscesses present.     Pharynx: Uvula midline. No pharyngeal swelling, oropharyngeal exudate or posterior oropharyngeal erythema.     Tonsils: No tonsillar exudate.      Comments: No sublingual or submental tenderness to palpation, patient able to swallow.  No edema in the neck, no crepitus.  Patient unwilling to allow this provider to examine the mouth thoroughly; hesitant to allow this provider to touch her face, unwilling to open up her jaw wide enough for  me to fully examine her oropharynx thoroughly.  Eyes:     General: No scleral icterus.       Right eye: No discharge.        Left eye: No discharge.     Extraocular Movements:     Right eye: No nystagmus.     Left eye: No nystagmus.     Conjunctiva/sclera: Conjunctivae normal.     Pupils: Pupils are equal, round, and reactive to light.  Neck:     Trachea: Trachea and phonation normal.  Cardiovascular:     Pulses:          Radial pulses are 2+ on the right side and 2+ on the left side.     Heart sounds: Normal heart sounds.  Pulmonary:     Effort: Pulmonary effort is normal. No bradypnea, accessory muscle  usage, prolonged expiration or respiratory distress.  Chest:     Chest wall: No deformity, swelling, tenderness or crepitus.  Musculoskeletal:     Cervical back: Full passive range of motion without pain. No edema, rigidity, tenderness or crepitus. No pain with movement, spinous process tenderness or muscular tenderness.  Lymphadenopathy:     Cervical: No cervical adenopathy.  Skin:    General: Skin is warm and dry.     Capillary Refill: Capillary refill takes less than 2 seconds.  Neurological:     General: No focal deficit present.     Mental Status: She is alert and oriented to person, place, and time.  Psychiatric:        Mood and Affect: Mood normal.     ED Results / Procedures / Treatments   Labs (all labs ordered are listed, but only abnormal results are displayed) Labs Reviewed - No data to display  EKG None  Radiology No results found.  Procedures Procedures (including critical care time)  Medications Ordered in ED Medications  HYDROcodone-acetaminophen (NORCO/VICODIN) 5-325 MG per tablet 1 tablet (1 tablet Oral Given 03/26/20 1245)  ibuprofen (ADVIL) tablet 600 mg (600 mg Oral Given 03/26/20 1245)    ED Course  I have reviewed the triage vital signs and the nursing notes.  Pertinent labs & imaging results that were available during my care of the  patient were reviewed by me and considered in my medical decision making (see chart for details).    MDM Rules/Calculators/A&P                         34 year old female with history of dental issues who presents with 2 days of right cheek pain and swelling.   Patient tachycardic on intake, no longer tachycardic at time of my exam.  Patient is afebrile.  Fracture of the right first molar on the mandible with associated swelling and tenderness to palpation at the gums and on the buccal mucosa.  Swelling of the right cheek.  No submental/sublingual tenderness palpation to suggest Ludwig's angina.  No posterior pharyngeal or tonsillar erythema/edema.  Analgesia offered while in the emergency department.  Patient does have a driver.  Reported allergies to hydrocodone and oxycodone, however patient has tolerated Vicodin in the past, states she would like to proceed with it today.  Will administer that as well as ibuprofen in the emergency department.  Patient with obvious dental abscess, will discharge with course of clindamycin and high-dose ibuprofen.  Recommend close dental follow-up.  Return precautions were given. Kambryn voiced understanding of her medical evaluation treatment plan.  Each of her questions were answered to her expressed affection.  Patient is stable and appropriate for discharge at this time.  Final Clinical Impression(s) / ED Diagnoses Final diagnoses:  Dental abscess    Rx / DC Orders ED Discharge Orders         Ordered    clindamycin (CLEOCIN) 300 MG capsule  4 times daily        03/26/20 [address] Eugene Gavia, PA-C 03/26/20 1254    Tegeler, Canary Brim, MD 03/26/20 [phone]

## 2020-03-26 NOTE — ED Triage Notes (Signed)
States pain and swelling to right side of face. Thinks she has a abscess

## 2020-03-26 NOTE — ED Notes (Signed)
Asked PA about pt listed allergy of hydrocodone. She states pt told her that she had it in May with not difficulty and it was Ok to give to pt.

## 2020-03-26 NOTE — Discharge Instructions (Addendum)
You were evaluated in the emergency department today for your dental pain.  You had an obvious infection your right first molar, with associated infection and swelling in your cheek.  You have been prescribed an oral antibiotic called clindamycin, which you should take as prescribed for the entire course.  Please do not save any antibiotics for future use.  Additionally you were administered single dose of narcotic pain medication while in the emergency department.  You may utilize Tylenol and ibuprofen as needed at home for pain. You have also been prescribed a high dose of ibuprofen - please only take one of these at a time. You may alternate this with tylenol for your pain.   Below is the contact information for dental resources in the area.  Please schedule appointment for follow-up as soon as you are able after completion of antibiotics.  Return to the emergency department if develop any fevers, chills, nausea, vomiting that does not stop, or any other new severe symptoms.

## 2020-06-10 ENCOUNTER — Other Ambulatory Visit: Payer: Self-pay

## 2020-06-10 ENCOUNTER — Encounter (HOSPITAL_BASED_OUTPATIENT_CLINIC_OR_DEPARTMENT_OTHER): Payer: Self-pay | Admitting: Emergency Medicine

## 2020-06-10 ENCOUNTER — Emergency Department (HOSPITAL_BASED_OUTPATIENT_CLINIC_OR_DEPARTMENT_OTHER)
Admission: EM | Admit: 2020-06-10 | Discharge: 2020-06-10 | Disposition: A | Discharge status: Home / Self Care | Payer: Self-pay | Attending: Emergency Medicine | Admitting: Emergency Medicine

## 2020-06-10 ENCOUNTER — Other Ambulatory Visit (HOSPITAL_BASED_OUTPATIENT_CLINIC_OR_DEPARTMENT_OTHER): Payer: Self-pay | Admitting: Medical

## 2020-06-10 DIAGNOSIS — Z955 Presence of coronary angioplasty implant and graft: Secondary | ICD-10-CM | POA: Insufficient documentation

## 2020-06-10 DIAGNOSIS — I10 Essential (primary) hypertension: Secondary | ICD-10-CM | POA: Insufficient documentation

## 2020-06-10 DIAGNOSIS — K029 Dental caries, unspecified: Secondary | ICD-10-CM | POA: Insufficient documentation

## 2020-06-10 DIAGNOSIS — Z7982 Long term (current) use of aspirin: Secondary | ICD-10-CM | POA: Insufficient documentation

## 2020-06-10 DIAGNOSIS — J45909 Unspecified asthma, uncomplicated: Secondary | ICD-10-CM | POA: Insufficient documentation

## 2020-06-10 DIAGNOSIS — R030 Elevated blood-pressure reading, without diagnosis of hypertension: Secondary | ICD-10-CM

## 2020-06-10 DIAGNOSIS — Z79899 Other long term (current) drug therapy: Secondary | ICD-10-CM | POA: Insufficient documentation

## 2020-06-10 DIAGNOSIS — F17210 Nicotine dependence, cigarettes, uncomplicated: Secondary | ICD-10-CM | POA: Insufficient documentation

## 2020-06-10 MED ORDER — CLINDAMYCIN HCL 150 MG PO CAPS
450.0000 mg | ORAL_CAPSULE | Freq: Once | ORAL | Status: AC
  Administered 2020-06-10: 450 mg via ORAL
  Filled 2020-06-10: qty 3

## 2020-06-10 MED ORDER — CLINDAMYCIN HCL 150 MG PO CAPS
450.0000 mg | ORAL_CAPSULE | Freq: Three times a day (TID) | ORAL | 0 refills | Status: AC
Start: 2020-06-10 — End: 2020-06-17

## 2020-06-10 MED ORDER — HYDROCODONE-ACETAMINOPHEN 5-325 MG PO TABS
1.0000 | ORAL_TABLET | Freq: Once | ORAL | Status: AC
  Administered 2020-06-10: 1 via ORAL
  Filled 2020-06-10: qty 1

## 2020-06-10 MED FILL — CLINDAMYCIN HCL 150 MG CAPS: 150 | 7 days supply | Qty: 63 | Fill #0

## 2020-06-10 NOTE — ED Provider Notes (Signed)
[facility] EMERGENCY DEPARTMENT Provider Note   CSN: [id number] Arrival date & time: 06/10/20  1145     History Chief Complaint  Patient presents with  . Dental Pain    Margaret Carpenter is a 35 y.o. female history of STEMI/cardiac arrest, hypertension, anemia, asthma.  Patient presents today for right lower dental pain onset yesterday describes severe throbbing pain constant nonradiating worse with chewing on the right side no alleviating factors.  Patient reports in December she was seen for the same tooth pain she took her antibiotic clindamycin had complete improvement of symptoms but unfortunately did not follow-up with a dentist.  She reports that in December she had swelling of her right jaw but has not noticed any swelling today.  Denies fever/chills, nausea/vomiting, difficulty swallowing, drooling, voice change or any additional concerns. HPI     Past Medical History:  Diagnosis Date  . Anemia   . Asthma   . Cardiac arrest with ventricular fibrillation (HCC) 02/22/2017  . Headache    "weekly" (02/23/2017)  . Hypertension   . STEMI (ST elevation myocardial infarction) (HCC) 02/22/2017    Patient Active Problem List   Diagnosis Date Noted  . 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 ST elevation myocardial infarction (STEMI) (HCC)   . Ventricular fibrillation (HCC)   . Cardiac arrest [facility])     Past Surgical History:  Procedure Laterality Date  . CORONARY ANGIOPLASTY WITH STENT PLACEMENT  02/22/2017  . CORONARY/GRAFT ACUTE MI REVASCULARIZATION N/A 02/22/2017   Procedure: Coronary/Graft Acute MI Revascularization;  Surgeon: Lennette Bihari, MD;  Location: [facility] INVASIVE CV LAB;  Service: Cardiovascular;  Laterality: N/A;  . LEFT HEART CATH AND CORONARY ANGIOGRAPHY N/A 02/22/2017   Procedure: LEFT HEART CATH AND CORONARY ANGIOGRAPHY;  Surgeon: Lennette Bihari, MD;  Location: [facility] INVASIVE CV LAB;  Service: Cardiovascular;  Laterality:  N/A;     OB History   No obstetric history on file.     Family History  Problem Relation Age of Onset  . Hypertension Mother   . Heart attack Maternal Grandfather     Social History   Tobacco Use  . Smoking status: Current Every Day Smoker    Packs/day: 0.10    Years: 18.00    Pack years: 1.80    Types: Cigarettes  . Smokeless tobacco: Never Used  Vaping Use  . Vaping Use: Former  Substance Use Topics  . Alcohol use: Yes    Comment: weekly  . Drug use: No    Home Medications Prior to Admission medications   Medication Sig Start Date End Date Taking? Authorizing Provider  clindamycin (CLEOCIN) 150 MG capsule Take 3 capsules (450 mg total) by mouth 3 (three) times daily for 7 days. 06/10/20 06/17/20 Yes Harlene Salts A, PA-C  acetaminophen (TYLENOL) 500 MG tablet Take 500 mg by mouth every 6 (six) hours as needed for moderate pain.    [provider]  aspirin 81 MG chewable tablet Chew 1 tablet (81 mg total) by mouth daily. 02/28/17   Arty Baumgartner, NP  atorvastatin (LIPITOR) 40 MG tablet Take 1 tablet (40 mg total) by mouth daily at 6 PM. 02/27/17   Arty Baumgartner, NP  carvedilol (COREG) 6.25 MG tablet Take 1 tablet (6.25 mg total) by mouth 2 (two) times daily with a meal. 02/27/17   Arty Baumgartner, NP  clopidogrel (PLAVIX) 75 MG tablet Take 1 tablet (75 mg total) by mouth daily with breakfast. 02/28/17  Arty Baumgartner, NP  isosorbide mononitrate (IMDUR) 30 MG 24 hr tablet Take 0.5 tablets (15 mg total) by mouth daily. 03/09/17   Azalee Course, PA  naproxen (NAPROSYN) 500 MG tablet Take 1 tablet (500 mg total) by mouth 2 (two) times daily. 10/31/17   Khatri, Hina, PA-C  nitroGLYCERIN (NITROSTAT) 0.4 MG SL tablet Place 1 tablet (0.4 mg total) under the tongue every 5 (five) minutes as needed. 02/27/17   Arty Baumgartner, NP    Allergies    Penicillins, Amoxicillin, Hydrocodone, Morphine and related, and Percocet [oxycodone-acetaminophen]  Review of  Systems   Review of Systems  Constitutional: Negative.  Negative for chills and fever.  HENT: Positive for dental problem. Negative for drooling, facial swelling, trouble swallowing and voice change.   Gastrointestinal: Negative.  Negative for abdominal pain, nausea and vomiting.    Physical Exam Updated Vital Signs BP (!) 193/137 (BP Location: Left Arm)   Pulse 97   Temp 98.3 F (36.8 C) (Oral)   Resp 18   Ht 5\' 3"  (1.6 m)   Wt 72.1 kg   SpO2 100%   BMI 28.17 kg/m   Physical Exam Constitutional:      General: She is not in acute distress.    Appearance: Normal appearance. She is well-developed. She is not ill-appearing or diaphoretic.  HENT:     Head: Normocephalic and atraumatic.     Jaw: There is normal jaw occlusion. No trismus.     Right Ear: External ear normal.     Left Ear: External ear normal.     Nose: Nose normal.     Mouth/Throat:     Comments: Poor dentition overall multiple missing teeth and dental caries.  Tooth #29 is source of patient's pain is severely decayed down to the gumline.  Tenderness to palpation of this area no gingival fluctuance swelling or drainage.  The patient has normal phonation and is in control of secretions. No stridor.  Midline uvula without edema. Soft palate rises symmetrically. No tonsillar erythema, swelling or exudates. Tongue protrusion is normal, floor of mouth is soft. No trismus. No creptius on neck palpation. No gingival erythema or fluctuance noted. Mucus membranes moist. No pallor noted. Eyes:     General: Vision grossly intact. Gaze aligned appropriately.     Extraocular Movements: Extraocular movements intact.     Conjunctiva/sclera: Conjunctivae normal.     Pupils: Pupils are equal, round, and reactive to light.  Neck:     Trachea: Trachea and phonation normal. No tracheal tenderness or tracheal deviation.  Pulmonary:     Effort: Pulmonary effort is normal. No respiratory distress.  Abdominal:     General: There is no  distension.     Palpations: Abdomen is soft.     Tenderness: There is no abdominal tenderness. There is no guarding or rebound.  Musculoskeletal:        General: Normal range of motion.     Cervical back: Normal range of motion and neck supple.  Skin:    General: Skin is warm and dry.  Neurological:     Mental Status: She is alert.     GCS: GCS eye subscore is 4. GCS verbal subscore is 5. GCS motor subscore is 6.     Comments: Speech is clear and goal oriented, follows commands Major Cranial nerves without deficit, no facial droop Moves extremities without ataxia, coordination intact  Psychiatric:        Behavior: Behavior normal.     ED  Results / Procedures / Treatments   Labs (all labs ordered are listed, but only abnormal results are displayed) Labs Reviewed - No data to display  EKG None  Radiology No results found.  Procedures Procedures   Medications Ordered in ED Medications  HYDROcodone-acetaminophen (NORCO/VICODIN) 5-325 MG per tablet 1 tablet (1 tablet Oral Given 06/10/20 1223)  clindamycin (CLEOCIN) capsule 450 mg (450 mg Oral Given 06/10/20 1223)    ED Course  I have reviewed the triage vital signs and the nursing notes.  Pertinent labs & imaging results that were available during my care of the patient were reviewed by me and considered in my medical decision making (see chart for details).    MDM Rules/Calculators/A&P                         Additional history obtained from: 1. Nursing notes from this visit. 2. Electronic medical records ------------------------ 35 year old female with history as above presented for right lower dental pain onset yesterday.  Poor dentition overall multiple dental caries present.  Severely decayed right lower tooth, appears to be #29, is source of patient's symptoms today.  Patient without signs or symptoms of dental abscess, no swelling/erythema/tenderness of the gums.  Patient is well-appearing, afebrile, nontoxic,  speaking well.  Patient able to swallow without pain.  No signs of swelling or concern for Ludwig's angina/Peritonsilar abscess/Retropharyngeal abscess or other deep tissue infections.  No sign of swelling of the neck, patient has good range of motion of the neck, no trismus.  Will treat with clindamycin 450 mg 3 times daily x7 days and give referral to dentist.  OTC anti-inflammatories and other home remedies discussed.  Patient was given 1 Norco in the emergency department for her acute pain, she is aware of narcotic precautions and her family member is here to drive home today.  Of note patient denies chance of pregnancy today.  Patient states understanding that medications given or prescribed today may result in harm to of a pregnancy and she accepts these risks and still chooses not to be pregnancy tested and proceed with medications.  Incidentally patient noted be hypertensive on arrival with this appears to be patient's baseline.  Patient is asymptomatic regarding her blood pressure, additionally may be elevated due to acute dental pain.  I have strongly encourage patient to take her daily medications as prescribed by her PCP and have blood pressure rechecked within 1 week and discuss medication management with her primary care provider at that time.  Patient informed of signs/symptoms of hypertensive urgency/emergency and to return to the ER today.  At this time there does not appear to be any evidence of an acute emergency medical condition and the patient appears stable for discharge with appropriate outpatient follow up. Diagnosis was discussed with patient who verbalizes understanding of care plan and is agreeable to discharge. I have discussed return precautions with patient who verbalizes understanding. Patient encouraged to follow-up with their PCP and dentist. All questions answered.   Note: Portions of this report may have been transcribed using voice recognition software. Every effort was  made to ensure accuracy; however, inadvertent computerized transcription errors may still be present. Final Clinical Impression(s) / ED Diagnoses Final diagnoses:  Pain due to dental caries  Elevated blood pressure reading    Rx / DC Orders ED Discharge Orders         Ordered    clindamycin (CLEOCIN) 150 MG capsule  3 times daily  06/10/20 1239           Bill Salinas, PA-C 06/10/20 1239    Sabino Donovan, MD 06/11/20 [phone]

## 2020-06-10 NOTE — Discharge Instructions (Signed)
At this time there does not appear to be the presence of an emergent medical condition, however there is always the potential for conditions to change. Please read and follow the below instructions.  Please return to the Emergency Department immediately for any new or worsening symptoms. Please be sure to follow up with your Primary Care Provider within one week regarding your visit today; please call their office to schedule an appointment even if you are feeling better for a follow-up visit. Please take your antibiotic Clindamycin as prescribed until complete to help with your symptoms.  Please drink enough water to avoid dehydration and get plenty of rest. Please call the dentist Dr. Mia Creek today to schedule follow-up appointment for definitive dental care. You received 1 pain medication today called Norco this will make you drowsy.  Do not drive, drink alcohol or perform any potentially dangerous activities for the rest of the day. Your blood pressure was elevated in the emergency department today.  Please take your daily medications and your blood pressure medication as prescribed by your primary care provider and heart specialist.  Please have your blood pressure rechecked this week by your primary care provider or heart specialist to ensure improvement  Go to the nearest Emergency Department immediately if: You have fever or chills You cannot open your mouth. You are having trouble breathing or swallowing. You have a fever. Your face, neck, or jaw is swollen. Get a very bad headache. Start to feel mixed up (confused). Feel weak or numb. Feel faint. Have very bad pain in your: Chest. Belly (abdomen). Throw up more than once. Have trouble breathing. You have any new/concerning or worsening of symptoms   Please read the additional information packets attached to your discharge summary.  Do not take your medicine if  develop an itchy rash, swelling in your mouth or lips, or  difficulty breathing; call 911 and seek immediate emergency medical attention if this occurs.  You may review your lab tests and imaging results in their entirety on your MyChart account.  Please discuss all results of fully with your primary care provider and other specialist at your follow-up visit.  Note: Portions of this text may have been transcribed using voice recognition software. Every effort was made to ensure accuracy; however, inadvertent computerized transcription errors may still be present.

## 2020-06-10 NOTE — ED Triage Notes (Signed)
Reports pain to right lower mouth from bad tooth.  Seen previously for the same but didn't get tooth pulled.

## 2020-09-29 ENCOUNTER — Emergency Department (HOSPITAL_BASED_OUTPATIENT_CLINIC_OR_DEPARTMENT_OTHER)
Admission: EM | Admit: 2020-09-29 | Discharge: 2020-09-29 | Disposition: A | Discharge status: Home / Self Care | Payer: Self-pay | Attending: Emergency Medicine | Admitting: Emergency Medicine

## 2020-09-29 ENCOUNTER — Other Ambulatory Visit (HOSPITAL_BASED_OUTPATIENT_CLINIC_OR_DEPARTMENT_OTHER): Payer: Self-pay

## 2020-09-29 ENCOUNTER — Other Ambulatory Visit: Payer: Self-pay

## 2020-09-29 ENCOUNTER — Encounter (HOSPITAL_BASED_OUTPATIENT_CLINIC_OR_DEPARTMENT_OTHER): Payer: Self-pay | Admitting: *Deleted

## 2020-09-29 DIAGNOSIS — I10 Essential (primary) hypertension: Secondary | ICD-10-CM | POA: Insufficient documentation

## 2020-09-29 DIAGNOSIS — Z7982 Long term (current) use of aspirin: Secondary | ICD-10-CM | POA: Insufficient documentation

## 2020-09-29 DIAGNOSIS — Z955 Presence of coronary angioplasty implant and graft: Secondary | ICD-10-CM | POA: Insufficient documentation

## 2020-09-29 DIAGNOSIS — K0889 Other specified disorders of teeth and supporting structures: Secondary | ICD-10-CM | POA: Insufficient documentation

## 2020-09-29 DIAGNOSIS — Z7902 Long term (current) use of antithrombotics/antiplatelets: Secondary | ICD-10-CM | POA: Insufficient documentation

## 2020-09-29 DIAGNOSIS — Z79899 Other long term (current) drug therapy: Secondary | ICD-10-CM | POA: Insufficient documentation

## 2020-09-29 DIAGNOSIS — J45909 Unspecified asthma, uncomplicated: Secondary | ICD-10-CM | POA: Insufficient documentation

## 2020-09-29 DIAGNOSIS — F17210 Nicotine dependence, cigarettes, uncomplicated: Secondary | ICD-10-CM | POA: Insufficient documentation

## 2020-09-29 MED ORDER — CHLORHEXIDINE GLUCONATE 0.12 % MT SOLN
10.0000 mL | Freq: Two times a day (BID) | OROMUCOSAL | 0 refills | Status: AC
  Filled 2020-09-29: qty 473, 24d supply, fill #0

## 2020-09-29 MED ORDER — DIPHENHYDRAMINE HCL 25 MG PO CAPS
25.0000 mg | ORAL_CAPSULE | Freq: Once | ORAL | Status: AC
  Administered 2020-09-29: 25 mg via ORAL
  Filled 2020-09-29: qty 1

## 2020-09-29 MED ORDER — CLINDAMYCIN HCL 150 MG PO CAPS
300.0000 mg | ORAL_CAPSULE | Freq: Three times a day (TID) | ORAL | 0 refills | Status: AC
  Filled 2020-09-29: qty 42, 7d supply, fill #0

## 2020-09-29 MED ORDER — OXYCODONE-ACETAMINOPHEN 5-325 MG PO TABS
1.0000 | ORAL_TABLET | Freq: Once | ORAL | Status: AC
Start: 2020-09-29 — End: 2020-09-29
  Administered 2020-09-29: 1 via ORAL
  Filled 2020-09-29: qty 1

## 2020-09-29 MED ORDER — IBUPROFEN 600 MG PO TABS
600.0000 mg | ORAL_TABLET | Freq: Four times a day (QID) | ORAL | 0 refills | Status: AC | PRN
  Filled 2020-09-29: qty 30, 8d supply, fill #0

## 2020-09-29 MED ORDER — IBUPROFEN 400 MG PO TABS
600.0000 mg | ORAL_TABLET | Freq: Once | ORAL | Status: AC
  Administered 2020-09-29: 600 mg via ORAL
  Filled 2020-09-29: qty 1

## 2020-09-29 MED ORDER — CLINDAMYCIN HCL 150 MG PO CAPS
300.0000 mg | ORAL_CAPSULE | Freq: Once | ORAL | Status: AC
  Administered 2020-09-29: 300 mg via ORAL
  Filled 2020-09-29: qty 2

## 2020-09-29 NOTE — ED Provider Notes (Addendum)
[facility] EMERGENCY DEPARTMENT Provider Note   CSN: [id number] Arrival date & time: 09/29/20  2993     History Chief Complaint  Patient presents with   Dental Pain    Margaret Carpenter is a 35 y.o. female presented to ED with dental pain.  She says been ongoing issue for several months.  She reports the left upper side of her face feels swollen and she thinks there may be an infection.  She has seen a dentist in the past and has had 2 teeth pulled, but feels she needs to have multiple of her other teeth pulled.  She reports occasional chip in fragment.  She feels like they are "falling apart".  She denies fevers or chills.  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   ST elevation myocardial infarction (STEMI) [facility]    Ventricular fibrillation (HCC)    Cardiac arrest [facility])     Past Surgical History:  Procedure Laterality Date   CORONARY ANGIOPLASTY WITH STENT PLACEMENT  02/22/2017   CORONARY/GRAFT ACUTE MI REVASCULARIZATION N/A 02/22/2017   Procedure: Coronary/Graft Acute MI Revascularization;  Surgeon: Lennette Bihari, MD;  Location: [facility] INVASIVE CV LAB;  Service: Cardiovascular;  Laterality: N/A;   LEFT HEART CATH AND CORONARY ANGIOGRAPHY N/A 02/22/2017   Procedure: LEFT HEART CATH AND CORONARY ANGIOGRAPHY;  Surgeon: Lennette Bihari, MD;  Location: [facility] INVASIVE CV LAB;  Service: Cardiovascular;  Laterality: N/A;     OB History   No obstetric history on file.     Family History  Problem Relation Age of Onset   Hypertension Mother    Heart attack Maternal Grandfather     Social  History   Tobacco Use   Smoking status: Every Day    Packs/day: 0.10    Years: 18.00    Pack years: 1.80    Types: Cigarettes   Smokeless tobacco: Never  Vaping Use   Vaping Use: Former  Substance Use Topics   Alcohol use: Yes    Comment: weekly   Drug use: No    Home Medications Prior to Admission medications   Medication Sig Start Date End Date Taking? Authorizing Provider  chlorhexidine (PERIDEX) 0.12 % solution Use as directed 10 mLs in the mouth or throat 2 (two) times daily for 10 days. 09/29/20 10/23/20 Yes Kamalani Mastro, Kermit Balo, MD  clindamycin (CLEOCIN) 150 MG capsule Take 2 capsules (300 mg total) by mouth 3 (three) times daily for 7 days. 09/29/20 10/06/20 Yes Toleen Lachapelle, Kermit Balo, MD  ibuprofen (ADVIL) 600 MG tablet Take 1 tablet (600 mg total) by mouth every 6 (six) hours as needed for moderate or mild pain. 09/29/20  Yes Lianne Carreto, Kermit Balo, MD  acetaminophen (TYLENOL) 500 MG tablet Take 500 mg by mouth every 6 (six) hours as needed for moderate pain.    [provider]  aspirin 81 MG chewable tablet Chew 1 tablet (81 mg total) by mouth daily. 02/28/17   Arty Baumgartner, NP  atorvastatin (LIPITOR) 40 MG tablet Take 1 tablet (40 mg total) by mouth daily at 6 PM. 02/27/17  Arty Baumgartner, NP  carvedilol (COREG) 6.25 MG tablet Take 1 tablet (6.25 mg total) by mouth 2 (two) times daily with a meal. 02/27/17   Arty Baumgartner, NP  clindamycin (CLEOCIN) 150 MG capsule TAKE 3 CAPSULES BY MOUTH 3 TIMES DAILY FOR 7 DAYS 06/10/20 06/10/21  Harlene Salts A, PA-C  clindamycin (CLEOCIN) 300 MG capsule TAKE 1 CAPSULE BY MOUTH 4 TIMES DAILY FOR 7 DAYS 03/26/20 03/26/21  Sponseller, Eugene Gavia, PA-C  clopidogrel (PLAVIX) 75 MG tablet Take 1 tablet (75 mg total) by mouth daily with breakfast. 02/28/17   Laverda Page B, NP  isosorbide mononitrate (IMDUR) 30 MG 24 hr tablet Take 0.5 tablets (15 mg total) by mouth daily. 03/09/17   Azalee Course, PA  naproxen (NAPROSYN) 500 MG tablet Take  1 tablet (500 mg total) by mouth 2 (two) times daily. 10/31/17   Khatri, Hina, PA-C  nitroGLYCERIN (NITROSTAT) 0.4 MG SL tablet Place 1 tablet (0.4 mg total) under the tongue every 5 (five) minutes as needed. 02/27/17   Arty Baumgartner, NP    Allergies    Penicillins, Amoxicillin, Hydrocodone, Morphine and related, and Percocet [oxycodone-acetaminophen]  Review of Systems   Review of Systems  Constitutional:  Negative for chills and fever.  HENT:  Positive for dental problem. Negative for sore throat.   Respiratory:  Negative for cough and shortness of breath.   Cardiovascular:  Negative for chest pain and palpitations.  Musculoskeletal:  Negative for arthralgias and back pain.   Physical Exam Updated Vital Signs BP (!) 152/108   Pulse 82   Temp 99.1 F (37.3 C) (Oral)   Resp 16   Ht 5\' 4"  (1.626 m)   Wt 71.2 kg   LMP 09/29/2020 (Exact Date)   SpO2 100%   BMI 26.95 kg/m   Physical Exam Constitutional:      General: She is not in acute distress. HENT:     Head: Normocephalic and atraumatic.     Comments: Poor dentitia, no frank abscess  Eyes:     Conjunctiva/sclera: Conjunctivae normal.     Pupils: Pupils are equal, round, and reactive to light.  Cardiovascular:     Rate and Rhythm: Normal rate and regular rhythm.  Pulmonary:     Effort: Pulmonary effort is normal. No respiratory distress.  Skin:    General: Skin is warm and dry.  Neurological:     General: No focal deficit present.     Mental Status: She is alert. Mental status is at baseline.    ED Results / Procedures / Treatments   Labs (all labs ordered are listed, but only abnormal results are displayed) Labs Reviewed - No data to display  EKG None  Radiology No results found.  Procedures Procedures   Medications Ordered in ED Medications  clindamycin (CLEOCIN) capsule 300 mg (300 mg Oral Given 09/29/20 0906)  ibuprofen (ADVIL) tablet 600 mg (600 mg Oral Given 09/29/20 0905)   oxyCODONE-acetaminophen (PERCOCET/ROXICET) 5-325 MG per tablet 1 tablet (1 tablet Oral Given 09/29/20 0906)  diphenhydrAMINE (BENADRYL) capsule 25 mg (25 mg Oral Given 09/29/20 10/01/20)    ED Course  I have reviewed the triage vital signs and the nursing notes.  Pertinent labs & imaging results that were available during my care of the patient were reviewed by me and considered in my medical decision making (see chart for details).  35 year old here complaining of dental pain ongoing for several months but worsening the past few days.  She does have some  chipped teeth and generally poor dentition.  She has had a few teeth removed in the past.  She likely needs to have several more teeth extracted based on his exam.  Do not see any evidence of peridental abscess or cellulitis or deep space infection at this time.  Doubt PTA.  I think is reasonable to start on penicillin given her multiple cavities and carries, for possible infection.  Also provide a referral to both an oral surgeon and a general dentist.  She is uninsured and has no money, but states in the past she was able to see be seen in the clinic as follow up from the ED.  Percocet given here once for pain, benadryl to prevent itching (mild reaction from opioids in the past).  Started on clindamycin due to penicillin allergies.     Final Clinical Impression(s) / ED Diagnoses Final diagnoses:  Pain, dental    Rx / DC Orders ED Discharge Orders          Ordered    clindamycin (CLEOCIN) 150 MG capsule  3 times daily        09/29/20 0847    chlorhexidine (PERIDEX) 0.12 % solution  2 times daily        09/29/20 0847    ibuprofen (ADVIL) 600 MG tablet  Every 6 hours PRN        09/29/20 0847             Terald Sleeper, MD 09/29/20 1419    Terald Sleeper, MD 09/29/20 1419

## 2020-09-29 NOTE — ED Triage Notes (Signed)
C/o left tooth pain onset yesterday  left side of face swollen  states since her heart attack has had a lot of dental problems

## 2020-09-29 NOTE — Discharge Instructions (Addendum)
You need to follow-up with a dentist or an oral surgeon.  You can start by calling Dr. Loel Ro office, who is an Transport planner.  Talk to them on the phone about your visit to the ER and see if they can see you in the office as a referral.  If they will not cover your cost, you can also try the office of Dr Mia Creek, who is our dentist on call today.

## 2020-10-08 ENCOUNTER — Other Ambulatory Visit (HOSPITAL_BASED_OUTPATIENT_CLINIC_OR_DEPARTMENT_OTHER): Payer: Self-pay

## 2020-10-08 MED ORDER — CLINDAMYCIN HCL 300 MG PO CAPS
ORAL_CAPSULE | ORAL | 1 refills | Status: DC
  Filled 2020-10-08: qty 28, 7d supply, fill #0

## 2022-06-10 ENCOUNTER — Other Ambulatory Visit: Payer: Self-pay

## 2022-06-10 ENCOUNTER — Encounter (HOSPITAL_BASED_OUTPATIENT_CLINIC_OR_DEPARTMENT_OTHER): Payer: Self-pay | Admitting: Emergency Medicine

## 2022-06-10 ENCOUNTER — Emergency Department (HOSPITAL_BASED_OUTPATIENT_CLINIC_OR_DEPARTMENT_OTHER)
Admission: EM | Admit: 2022-06-10 | Discharge: 2022-06-10 | Disposition: A | Discharge status: Home / Self Care | Payer: BC Managed Care – PPO | Attending: Emergency Medicine | Admitting: Emergency Medicine

## 2022-06-10 DIAGNOSIS — Z7902 Long term (current) use of antithrombotics/antiplatelets: Secondary | ICD-10-CM | POA: Diagnosis not present

## 2022-06-10 DIAGNOSIS — K0889 Other specified disorders of teeth and supporting structures: Secondary | ICD-10-CM | POA: Diagnosis not present

## 2022-06-10 DIAGNOSIS — Z7982 Long term (current) use of aspirin: Secondary | ICD-10-CM | POA: Diagnosis not present

## 2022-06-10 MED ORDER — CLINDAMYCIN HCL 150 MG PO CAPS
300.0000 mg | ORAL_CAPSULE | Freq: Once | ORAL | Status: AC
  Administered 2022-06-10: 300 mg via ORAL
  Filled 2022-06-10: qty 2

## 2022-06-10 MED ORDER — TRAMADOL HCL 50 MG PO TABS: 50.0000 mg | ORAL_TABLET | Freq: Four times a day (QID) | ORAL | 0 refills | Status: AC | PRN

## 2022-06-10 MED ORDER — TRAMADOL HCL 50 MG PO TABS
50.0000 mg | ORAL_TABLET | Freq: Once | ORAL | Status: AC
  Administered 2022-06-10: 50 mg via ORAL
  Filled 2022-06-10: qty 1

## 2022-06-10 MED ORDER — CLINDAMYCIN HCL 300 MG PO CAPS: 300.0000 mg | ORAL_CAPSULE | Freq: Four times a day (QID) | ORAL | 0 refills | Status: DC

## 2022-06-10 NOTE — Discharge Instructions (Signed)
Begin taking clindamycin as prescribed.  Take ibuprofen 600 mg every 6 hours as needed for pain.  Take tramadol as prescribed as needed for pain not relieved with ibuprofen.  Follow-up with your dentist next week.

## 2022-06-10 NOTE — ED Triage Notes (Signed)
Pt is c/o toothache on the left top x 3 days

## 2022-06-10 NOTE — ED Provider Notes (Signed)
[place] [facility] Provider Note   CSN: [id number] Arrival date & time: 06/10/22  [id number]     History  Chief Complaint  Patient presents with   Dental Pain    Margaret Carpenter is a 37 y.o. female.  Patient presenting with complaints of dental pain.  This has been worsening over the past few days.  She denies any difficulty breathing or swallowing.  Pain is worse when she eats or drinks.  No relief with over-the-counter medications.  The history is provided by the patient.       Home Medications Prior to Admission medications   Medication Sig Start Date End Date Taking? Authorizing Provider  clindamycin (CLEOCIN) 300 MG capsule Take 1 capsule (300 mg total) by mouth 4 (four) times daily. X 7 days 06/10/22  Yes Madsen Riddle, Nathaneil Canary, MD  traMADol (ULTRAM) 50 MG tablet Take 1 tablet (50 mg total) by mouth every 6 (six) hours as needed. 06/10/22  Yes Alessio Bogan, Nathaneil Canary, MD  acetaminophen (TYLENOL) 500 MG tablet Take 500 mg by mouth every 6 (six) hours as needed for moderate pain.    [provider]  aspirin 81 MG chewable tablet Chew 1 tablet (81 mg total) by mouth daily. 02/28/17   Cheryln Manly, NP  atorvastatin (LIPITOR) 40 MG tablet Take 1 tablet (40 mg total) by mouth daily at 6 PM. 02/27/17   Cheryln Manly, NP  carvedilol (COREG) 6.25 MG tablet Take 1 tablet (6.25 mg total) by mouth 2 (two) times daily with a meal. 02/27/17   Cheryln Manly, NP  clopidogrel (PLAVIX) 75 MG tablet Take 1 tablet (75 mg total) by mouth daily with breakfast. 02/28/17   Cheryln Manly, NP  ibuprofen (ADVIL) 600 MG tablet Take 1 tablet (600 mg total) by mouth every 6 (six) hours as needed for moderate or mild pain. 09/29/20   Wyvonnia Dusky, MD  isosorbide mononitrate (IMDUR) 30 MG 24 hr tablet Take 0.5 tablets (15 mg total) by mouth daily. 03/09/17   Almyra Deforest, PA  naproxen (NAPROSYN) 500 MG tablet Take 1 tablet (500 mg total) by mouth 2 (two) times daily.  10/31/17   Khatri, Hina, PA-C  nitroGLYCERIN (NITROSTAT) 0.4 MG SL tablet Place 1 tablet (0.4 mg total) under the tongue every 5 (five) minutes as needed. 02/27/17   Cheryln Manly, NP      Allergies    Penicillins, Amoxicillin, Hydrocodone, Morphine and related, and Percocet [oxycodone-acetaminophen]    Review of Systems   Review of Systems  All other systems reviewed and are negative.   Physical Exam Updated Vital Signs BP (!) 171/114 (BP Location: Right Arm)   Pulse (!) 105   Temp 98.5 F (36.9 C) (Oral)   Resp 18   Ht '5\' 4"'$  (1.626 m)   Wt 64.1 kg   LMP 06/02/2022 (Exact Date)   SpO2 98%   BMI 24.27 kg/m  Physical Exam Vitals and nursing note reviewed.  Constitutional:      Appearance: Normal appearance.  HENT:     Head: Normocephalic.     Mouth/Throat:     Comments: Patient with multiple caries and several decayed teeth.  There is gingival inflammation throughout, but no abscess noted. Pulmonary:     Effort: Pulmonary effort is normal.  Skin:    General: Skin is warm and dry.  Neurological:     Mental Status: She is alert.     ED Results / Procedures / Treatments  Labs (all labs ordered are listed, but only abnormal results are displayed) Labs Reviewed - No data to display  EKG None  Radiology No results found.  Procedures Procedures    Medications Ordered in ED Medications  traMADol (ULTRAM) tablet 50 mg (50 mg Oral Given 06/10/22 0258)  clindamycin (CLEOCIN) capsule 300 mg (300 mg Oral Given 06/10/22 0258)    ED Course/ Medical Decision Making/ A&P  Dental pain.  Will treat with clindamycin and tramadol.  Final Clinical Impression(s) / ED Diagnoses Final diagnoses:  Pain, dental    Rx / DC Orders ED Discharge Orders          Ordered    clindamycin (CLEOCIN) 300 MG capsule  4 times daily        06/10/22 0302    traMADol (ULTRAM) 50 MG tablet  Every 6 hours PRN        06/10/22 0302              Veryl Speak, MD 06/10/22  [phone]

## 2023-07-15 ENCOUNTER — Emergency Department (HOSPITAL_BASED_OUTPATIENT_CLINIC_OR_DEPARTMENT_OTHER)
Admission: EM | Admit: 2023-07-15 | Discharge: 2023-07-15 | Disposition: A | Discharge status: Home / Self Care | Payer: Self-pay | Attending: Emergency Medicine | Admitting: Emergency Medicine

## 2023-07-15 ENCOUNTER — Encounter (HOSPITAL_BASED_OUTPATIENT_CLINIC_OR_DEPARTMENT_OTHER): Payer: Self-pay | Admitting: Emergency Medicine

## 2023-07-15 ENCOUNTER — Other Ambulatory Visit: Payer: Self-pay

## 2023-07-15 DIAGNOSIS — Z7901 Long term (current) use of anticoagulants: Secondary | ICD-10-CM | POA: Insufficient documentation

## 2023-07-15 DIAGNOSIS — K029 Dental caries, unspecified: Secondary | ICD-10-CM

## 2023-07-15 DIAGNOSIS — K047 Periapical abscess without sinus: Secondary | ICD-10-CM | POA: Insufficient documentation

## 2023-07-15 DIAGNOSIS — Z7982 Long term (current) use of aspirin: Secondary | ICD-10-CM | POA: Insufficient documentation

## 2023-07-15 MED ORDER — CLINDAMYCIN HCL 300 MG PO CAPS: 300.0000 mg | ORAL_CAPSULE | Freq: Three times a day (TID) | ORAL | 0 refills | Status: AC

## 2023-07-15 MED ORDER — KETOROLAC TROMETHAMINE 15 MG/ML IJ SOLN
15.0000 mg | Freq: Once | INTRAMUSCULAR | Status: DC
  Filled 2023-07-15: qty 1

## 2023-07-15 NOTE — ED Provider Notes (Signed)
 [place] [facility]ERGENCY [facility]MENT AT [facility] Provider Note   CSN: [id number] Arrival date & time: 07/15/23  1833     History  Chief Complaint  Patient presents with   Dental Pain    Margaret Carpenter is a 38 y.o. female with overall noncontributory past medical history presents concern for left upper dental pain for 3 days.  Patient reports that she has not had similar pain before but she does have multiple broken teeth in the mouth.  She does not currently have a dentist.  She denies any fever, chills.  She reports significant pain associated with the swelling in the mouth.   Dental Pain      Home Medications Prior to Admission medications   Medication Sig Start Date End Date Taking? Authorizing Provider  clindamycin (CLEOCIN) 300 MG capsule Take 1 capsule (300 mg total) by mouth 3 (three) times daily. 07/15/23  Yes Jezelle Gullick H, PA-C  acetaminophen (TYLENOL) 500 MG tablet Take 500 mg by mouth every 6 (six) hours as needed for moderate pain.    [provider]  aspirin 81 MG chewable tablet Chew 1 tablet (81 mg total) by mouth daily. 02/28/17   Sanjuanita Cruz, NP  atorvastatin (LIPITOR) 40 MG tablet Take 1 tablet (40 mg total) by mouth daily at 6 PM. 02/27/17   Sanjuanita Cruz, NP  carvedilol (COREG) 6.25 MG tablet Take 1 tablet (6.25 mg total) by mouth 2 (two) times daily with a meal. 02/27/17   Sanjuanita Cruz, NP  clopidogrel (PLAVIX) 75 MG tablet Take 1 tablet (75 mg total) by mouth daily with breakfast. 02/28/17   Sanjuanita Cruz, NP  ibuprofen (ADVIL) 600 MG tablet Take 1 tablet (600 mg total) by mouth every 6 (six) hours as needed for moderate or mild pain. 09/29/20   Arvilla Birmingham, MD  isosorbide mononitrate (IMDUR) 30 MG 24 hr tablet Take 0.5 tablets (15 mg total) by mouth daily. 03/09/17   Meng, Hao, PA  naproxen (NAPROSYN) 500 MG tablet Take 1 tablet (500 mg total) by mouth 2 (two) times daily. 10/31/17   Khatri, Hina, PA-C   nitroGLYCERIN (NITROSTAT) 0.4 MG SL tablet Place 1 tablet (0.4 mg total) under the tongue every 5 (five) minutes as needed. 02/27/17   Sanjuanita Cruz, NP  traMADol (ULTRAM) 50 MG tablet Take 1 tablet (50 mg total) by mouth every 6 (six) hours as needed. 06/10/22   Orvilla Blander, MD      Allergies    Penicillins, Amoxicillin, Hydrocodone, Morphine and codeine, and Percocet [oxycodone-acetaminophen]    Review of Systems   Review of Systems  All other systems reviewed and are negative.   Physical Exam Updated Vital Signs BP (!) 151/100 (BP Location: Right Arm)   Pulse 95   Temp (!) 97.3 F (36.3 C)   Resp 20   Ht 5\' 4"  (1.626 m)   Wt 59 kg   SpO2 100%   BMI 22.31 kg/m  Physical Exam Vitals and nursing note reviewed.  Constitutional:      General: She is not in acute distress.    Appearance: Normal appearance.  HENT:     Head: Normocephalic and atraumatic.     Mouth/Throat:     Comments: Multiple broken teeth in the mouth, numerous dental caries, there is focal swelling in the left upper jaw around the tooth in question 1 of patient's molars, concern for dental abscess.  No other soft tissue swelling, no purulent drainage.  Uvula  midline, patient swallowing without difficulty, no dysphonia, no stridor, no trismus. Eyes:     General:        Right eye: No discharge.        Left eye: No discharge.  Cardiovascular:     Rate and Rhythm: Normal rate and regular rhythm.  Pulmonary:     Effort: Pulmonary effort is normal. No respiratory distress.  Musculoskeletal:        General: No deformity.  Skin:    General: Skin is warm and dry.  Neurological:     Mental Status: She is alert and oriented to person, place, and time.  Psychiatric:        Mood and Affect: Mood normal.        Behavior: Behavior normal.     ED Results / Procedures / Treatments   Labs (all labs ordered are listed, but only abnormal results are displayed) Labs Reviewed - No data to  display  EKG None  Radiology No results found.  Procedures Procedures    Medications Ordered in ED Medications  ketorolac (TORADOL) 15 MG/ML injection 15 mg (has no administration in time range)    ED Course/ Medical Decision Making/ A&P                                 Medical Decision Making   This an overall well-appearing 38 y.o. female who presents with concern for dental pain.  Physical exam reveals cavities, broken teeth in mouth.  Patient with redness, gum swelling with questionable developing gum abscess, no PTA, uvula deviation, pharyngitis, epiglottitis, dysphonia, stridor.  She is somewhat hypertensive, likely secondary to pain, blood pressure 151/100.  Patient without difficulty swallowing.  I recommended drainage of the fluid collection that I needed on exam but patient declines at this time stating that she would like to try antibiotics first.  No systemic fever, chills.  Given patient's symptoms think it is reasonable to treat with antibiotics.  Encouraged ibuprofen, Tylenol, Orajel, ice for pain control. Considered stronger pain control with Toradol, 1 dose administered in the emergency department.  Encouraged urgent dental follow-up, dental resource guide provided.  Provided clindamycin for antibiotic coverage.  Patient discharged in stable condition at this time, return precautions given. Final Clinical Impression(s) / ED Diagnoses Final diagnoses:  Pain due to dental caries  Dental infection    Rx / DC Orders ED Discharge Orders          Ordered    clindamycin (CLEOCIN) 300 MG capsule  3 times daily        07/15/23 1937              Doralyn Kirkes H, PA-C 07/15/23 1944    Scarlette Currier, MD 07/18/23 1327

## 2023-07-15 NOTE — ED Triage Notes (Signed)
 Pt c/o dental pain (LT upper) x a few days

## 2023-07-15 NOTE — Discharge Instructions (Addendum)
 Please use Tylenol or ibuprofen for pain.  You may use 600 mg ibuprofen every 6 hours or 1000 mg of Tylenol every 6 hours.  You may choose to alternate between the 2.  This would be most effective.  Not to exceed 4 g of Tylenol within 24 hours.  Not to exceed 3200 mg ibuprofen 24 hours.  You can use Orajel in addition to the above for pain directly where it hurts. Please take the entire course of antibiotics that have prescribed, and follow-up with the list of dental resources I have provided.  I did see some significant swelling around the gums but you declined attempted drainage of possible tooth associated abscess, you may need to return for drainage in the future if the pain and swelling persists.
